# Patient Record
Sex: Female | Born: 1970 | Race: White | Hispanic: No | Marital: Married | Smoking: Current every day smoker
Health system: Southern US, Community
[De-identification: ages and names within clinical notes are randomized; demographics above are authoritative.]

## PROBLEM LIST (undated history)

## (undated) DIAGNOSIS — M797 Fibromyalgia: Secondary | ICD-10-CM

## (undated) DIAGNOSIS — K219 Gastro-esophageal reflux disease without esophagitis: Secondary | ICD-10-CM

## (undated) DIAGNOSIS — Z9889 Other specified postprocedural states: Secondary | ICD-10-CM

## (undated) DIAGNOSIS — R112 Nausea with vomiting, unspecified: Secondary | ICD-10-CM

## (undated) DIAGNOSIS — E119 Type 2 diabetes mellitus without complications: Secondary | ICD-10-CM

## (undated) DIAGNOSIS — N3010 Interstitial cystitis (chronic) without hematuria: Secondary | ICD-10-CM

## (undated) DIAGNOSIS — M1990 Unspecified osteoarthritis, unspecified site: Secondary | ICD-10-CM

## (undated) DIAGNOSIS — G9050 Complex regional pain syndrome I, unspecified: Secondary | ICD-10-CM

## (undated) DIAGNOSIS — N289 Disorder of kidney and ureter, unspecified: Secondary | ICD-10-CM

## (undated) DIAGNOSIS — K828 Other specified diseases of gallbladder: Secondary | ICD-10-CM

## (undated) DIAGNOSIS — Z87442 Personal history of urinary calculi: Secondary | ICD-10-CM

## (undated) HISTORY — PX: CARPAL TUNNEL RELEASE: SHX101

## (undated) HISTORY — PX: ANTERIOR CRUCIATE LIGAMENT REPAIR: SHX115

## (undated) HISTORY — DX: Unspecified osteoarthritis, unspecified site: M19.90

## (undated) HISTORY — PX: TONSILLECTOMY: SUR1361

## (undated) HISTORY — PX: ABDOMINAL HYSTERECTOMY: SHX81

---

## 1997-11-21 ENCOUNTER — Encounter: Admission: RE | Admit: 1997-11-21 | Discharge: 1998-02-19 | Payer: Self-pay | Admitting: Anesthesiology

## 1998-09-22 ENCOUNTER — Ambulatory Visit (HOSPITAL_COMMUNITY): Admission: RE | Admit: 1998-09-22 | Discharge: 1998-09-22 | Payer: Self-pay | Admitting: Orthopedic Surgery

## 1998-09-22 ENCOUNTER — Encounter: Payer: Self-pay | Admitting: Orthopedic Surgery

## 1998-11-01 ENCOUNTER — Ambulatory Visit (HOSPITAL_COMMUNITY): Admission: RE | Admit: 1998-11-01 | Discharge: 1998-11-01 | Payer: Self-pay | Admitting: Orthopedic Surgery

## 1998-11-01 ENCOUNTER — Encounter: Payer: Self-pay | Admitting: Orthopedic Surgery

## 1998-11-15 ENCOUNTER — Ambulatory Visit (HOSPITAL_COMMUNITY): Admission: RE | Admit: 1998-11-15 | Discharge: 1998-11-15 | Payer: Self-pay | Admitting: Orthopedic Surgery

## 1998-11-15 ENCOUNTER — Encounter: Payer: Self-pay | Admitting: Orthopedic Surgery

## 1998-11-29 ENCOUNTER — Ambulatory Visit (HOSPITAL_COMMUNITY): Admission: RE | Admit: 1998-11-29 | Discharge: 1998-11-29 | Payer: Self-pay | Admitting: Orthopedic Surgery

## 1998-11-29 ENCOUNTER — Encounter: Payer: Self-pay | Admitting: Orthopedic Surgery

## 1999-03-20 ENCOUNTER — Ambulatory Visit (HOSPITAL_COMMUNITY): Admission: RE | Admit: 1999-03-20 | Discharge: 1999-03-20 | Payer: Self-pay | Admitting: Orthopedic Surgery

## 1999-03-20 ENCOUNTER — Encounter: Payer: Self-pay | Admitting: Orthopedic Surgery

## 1999-11-20 ENCOUNTER — Encounter: Payer: Self-pay | Admitting: Family Medicine

## 1999-11-20 ENCOUNTER — Encounter: Admission: RE | Admit: 1999-11-20 | Discharge: 1999-11-20 | Payer: Self-pay | Admitting: Family Medicine

## 2001-04-27 ENCOUNTER — Emergency Department (HOSPITAL_COMMUNITY): Admission: EM | Admit: 2001-04-27 | Discharge: 2001-04-27 | Payer: Self-pay | Admitting: Emergency Medicine

## 2002-12-22 ENCOUNTER — Emergency Department (HOSPITAL_COMMUNITY): Admission: EM | Admit: 2002-12-22 | Discharge: 2002-12-22 | Payer: Self-pay

## 2002-12-22 ENCOUNTER — Encounter: Payer: Self-pay | Admitting: Emergency Medicine

## 2004-10-15 ENCOUNTER — Emergency Department (HOSPITAL_COMMUNITY): Admission: EM | Admit: 2004-10-15 | Discharge: 2004-10-15 | Payer: Self-pay | Admitting: Emergency Medicine

## 2006-01-31 ENCOUNTER — Emergency Department (HOSPITAL_COMMUNITY): Admission: EM | Admit: 2006-01-31 | Discharge: 2006-01-31 | Payer: Self-pay | Admitting: Emergency Medicine

## 2006-09-11 ENCOUNTER — Ambulatory Visit (HOSPITAL_COMMUNITY): Admission: RE | Admit: 2006-09-11 | Discharge: 2006-09-11 | Payer: Self-pay | Admitting: Gastroenterology

## 2006-09-17 ENCOUNTER — Encounter (INDEPENDENT_AMBULATORY_CARE_PROVIDER_SITE_OTHER): Payer: Self-pay | Admitting: Specialist

## 2006-09-17 ENCOUNTER — Ambulatory Visit (HOSPITAL_COMMUNITY): Admission: RE | Admit: 2006-09-17 | Discharge: 2006-09-17 | Payer: Self-pay | Admitting: Gastroenterology

## 2006-09-29 ENCOUNTER — Encounter: Admission: RE | Admit: 2006-09-29 | Discharge: 2006-09-29 | Payer: Self-pay | Admitting: Gastroenterology

## 2006-10-24 ENCOUNTER — Encounter: Admission: RE | Admit: 2006-10-24 | Discharge: 2006-10-24 | Payer: Self-pay | Admitting: Gastroenterology

## 2006-12-16 ENCOUNTER — Emergency Department (HOSPITAL_COMMUNITY): Admission: EM | Admit: 2006-12-16 | Discharge: 2006-12-16 | Payer: Self-pay | Admitting: *Deleted

## 2006-12-19 ENCOUNTER — Ambulatory Visit (HOSPITAL_COMMUNITY): Admission: RE | Admit: 2006-12-19 | Discharge: 2006-12-19 | Payer: Self-pay | Admitting: Physical Therapy

## 2007-02-12 ENCOUNTER — Emergency Department (HOSPITAL_COMMUNITY): Admission: EM | Admit: 2007-02-12 | Discharge: 2007-02-12 | Payer: Self-pay | Admitting: Emergency Medicine

## 2007-04-07 ENCOUNTER — Encounter (INDEPENDENT_AMBULATORY_CARE_PROVIDER_SITE_OTHER): Payer: Self-pay | Admitting: Otolaryngology

## 2007-04-07 ENCOUNTER — Ambulatory Visit (HOSPITAL_COMMUNITY): Admission: RE | Admit: 2007-04-07 | Discharge: 2007-04-07 | Payer: Self-pay | Admitting: Otolaryngology

## 2008-02-09 ENCOUNTER — Emergency Department (HOSPITAL_COMMUNITY): Admission: EM | Admit: 2008-02-09 | Discharge: 2008-02-09 | Payer: Self-pay | Admitting: Emergency Medicine

## 2008-05-10 ENCOUNTER — Emergency Department (HOSPITAL_COMMUNITY): Admission: EM | Admit: 2008-05-10 | Discharge: 2008-05-10 | Payer: Self-pay | Admitting: Emergency Medicine

## 2008-06-27 HISTORY — PX: INTERSTIM IMPLANT PLACEMENT: SHX5130

## 2008-09-19 ENCOUNTER — Emergency Department (HOSPITAL_COMMUNITY): Admission: EM | Admit: 2008-09-19 | Discharge: 2008-09-19 | Payer: Self-pay | Admitting: Emergency Medicine

## 2008-09-22 ENCOUNTER — Emergency Department (HOSPITAL_COMMUNITY): Admission: EM | Admit: 2008-09-22 | Discharge: 2008-09-22 | Payer: Self-pay | Admitting: Emergency Medicine

## 2008-09-28 ENCOUNTER — Observation Stay (HOSPITAL_COMMUNITY): Admission: EM | Admit: 2008-09-28 | Discharge: 2008-09-28 | Payer: Self-pay | Admitting: Emergency Medicine

## 2010-09-04 LAB — CBC
HCT: 42.8 % (ref 36.0–46.0)
Hemoglobin: 14.4 g/dL (ref 12.0–15.0)
MCHC: 33.7 g/dL (ref 30.0–36.0)
Platelets: 313 10*3/uL (ref 150–400)
RDW: 13.2 % (ref 11.5–15.5)

## 2010-09-04 LAB — DIFFERENTIAL
Basophils Absolute: 0 10*3/uL (ref 0.0–0.1)
Basophils Relative: 0 % (ref 0–1)
Eosinophils Absolute: 0 10*3/uL (ref 0.0–0.7)
Eosinophils Relative: 0 % (ref 0–5)
Monocytes Absolute: 0.6 10*3/uL (ref 0.1–1.0)

## 2010-09-04 LAB — URINALYSIS, ROUTINE W REFLEX MICROSCOPIC
Glucose, UA: NEGATIVE mg/dL
Protein, ur: 100 mg/dL — AB
Specific Gravity, Urine: 1.025 (ref 1.005–1.030)

## 2010-09-04 LAB — URINE MICROSCOPIC-ADD ON

## 2010-09-04 LAB — URINE CULTURE

## 2010-09-04 LAB — POCT I-STAT, CHEM 8
Calcium, Ion: 1.12 mmol/L (ref 1.12–1.32)
HCT: 46 % (ref 36.0–46.0)
Hemoglobin: 15.6 g/dL — ABNORMAL HIGH (ref 12.0–15.0)
TCO2: 24 mmol/L (ref 0–100)

## 2010-09-05 LAB — COMPREHENSIVE METABOLIC PANEL
ALT: 29 U/L (ref 0–35)
AST: 25 U/L (ref 0–37)
Albumin: 4.7 g/dL (ref 3.5–5.2)
Calcium: 10.1 mg/dL (ref 8.4–10.5)
GFR calc Af Amer: >60 mL/min (ref >60)
Sodium: 139 mEq/L (ref 135–145)
Total Protein: 7.6 g/dL (ref 6.0–8.3)

## 2010-09-05 LAB — URINALYSIS, ROUTINE W REFLEX MICROSCOPIC
Glucose, UA: NEGATIVE mg/dL
Nitrite: NEGATIVE
Nitrite: NEGATIVE
Specific Gravity, Urine: 1.025 (ref 1.005–1.030)
pH: 5.5 (ref 5.0–8.0)
pH: 6 (ref 5.0–8.0)

## 2010-09-05 LAB — URINE CULTURE: Colony Count: 7000

## 2010-09-05 LAB — POCT I-STAT, CHEM 8
Calcium, Ion: 1.08 mmol/L — ABNORMAL LOW (ref 1.12–1.32)
Chloride: 108 mEq/L (ref 96–112)
Glucose, Bld: 150 mg/dL — ABNORMAL HIGH (ref 70–99)
HCT: 43 % (ref 36.0–46.0)
Hemoglobin: 14.6 g/dL (ref 12.0–15.0)

## 2010-09-05 LAB — URINE MICROSCOPIC-ADD ON

## 2010-09-05 LAB — DIFFERENTIAL
Eosinophils Absolute: 0.1 10*3/uL (ref 0.0–0.7)
Eosinophils Relative: 2 % (ref 0–5)
Lymphs Abs: 2.7 10*3/uL (ref 0.7–4.0)
Monocytes Relative: 6 % (ref 3–12)

## 2010-09-05 LAB — CBC
MCHC: 35.1 g/dL (ref 30.0–36.0)
Platelets: 320 10*3/uL (ref 150–400)
RBC: 4.79 MIL/uL (ref 3.87–5.11)
RDW: 12.5 % (ref 11.5–15.5)

## 2010-10-09 NOTE — Op Note (Signed)
NAME:  Bacigalupi, Myrlene                ACCOUNT NO.:  [id number]   MEDICAL RECORD NO.:  [id number]          PATIENT TYPE:  INP   LOCATION:  1517                         FACILITY:  [facility]   PHYSICIAN:  Bertram Millard. Dahlstedt, M.D.DATE OF BIRTH:  10-Apr-1971   DATE OF PROCEDURE:  09/28/2008  DATE OF DISCHARGE:                               OPERATIVE REPORT   PREOPERATIVE DIAGNOSIS:  Right UPJ stone, status post lithotripsy with  intractable pain, nausea and vomiting.   POSTOPERATIVE DIAGNOSIS:  Right UPJ stone, status post lithotripsy with  intractable pain, nausea and vomiting.   PROCEDURE:  Cystoscopy, right double-J stent placement.   SURGEON:  Bertram Millard. Dahlstedt, M.D.   ANESTHESIA:  General endotracheal.   COMPLICATIONS:  None.   BRIEF HISTORY:  A 40 year old female, patient of Dr. Logan Bores at [facility].  She presented to the  emergency room here in [place] about a week ago with a right sided  UPJ stone with pain.  She underwent lithotripsy by Dr. Logan Bores in [facility] approximately 12 hours ago.  Three hours after procedure she began  having significant pain, nausea and vomiting.  This was unrelieved with  Vicodin on several occasions.  She presented the emergency room.  Consultation was requested from me.  The patient has significant  hydronephrosis and renal edema.  This, combined with her clinical  symptomatology, led me to  recommend this urgent stent placement.  The  patient did not want to go to [facility] at this time.  She presents  for system stent placement.   DESCRIPTION OF PROCEDURE:  The patient received preoperative IV  antibiotics.  Surgical side was marked and she was taken to the  operating room where general anesthetic was administered using  endotracheal apparatus.  She was placed in the dorsal lithotomy  position.  Genitalia and perineum were prepped and draped.   Time-out was then called.  Proper procedure, proper  side, patient, drug  allergies, and medications were reviewed.  At this point the procedure  commenced.  A 22-French panendoscope was placed into her bladder.  The  bladder was inspected and found to be normal.  Ureteral orifices were  normal.  A guidewire was placed under fluoroscopic guidance into the  right renal pelvis where good curl was seen.  Over the top of this a 24  cm x 6-French contour stent  was placed.  Good curls were seen proximally and distally using  fluoroscopic and cystoscopic guidance after the guidewire was removed.  The bladder was drained and the procedure terminated.  She was awakened  and taken to PACU in stable condition.      Bertram Millard. Dahlstedt, M.D.  Electronically Signed     SMD/MEDQ  D:  09/28/2008  T:  09/28/2008  Job:  [id number]   cc:   Jamison Neighbor, M.D.  Fax: [phone]

## 2010-10-09 NOTE — Op Note (Signed)
NAME:  Barbara Wu, Barbara Wu                ACCOUNT NO.:  [id number]   MEDICAL RECORD NO.:  [id number]          PATIENT TYPE:  AMB   LOCATION:  SDS                          FACILITY:  [facility]   PHYSICIAN:  Christopher E. Ezzard Standing, M.D.DATE OF BIRTH:  03/04/1971   DATE OF PROCEDURE:  04/07/2007  DATE OF DISCHARGE:  04/07/2007                               OPERATIVE REPORT   PREOPERATIVE DIAGNOSIS:  Recurrent tonsillitis.   POSTOPERATIVE DIAGNOSIS:  Recurrent tonsillitis.   OPERATION PERFORMED:  The tonsillectomy.   SURGEON:  Narda Bonds, M.D.   ANESTHESIA:  General endotracheal.   COMPLICATIONS:  None.   BRIEF CLINICAL NOTE:  Barbara Wu is a 40 year old female who has had  several tonsil infections this past year.  She gets better on  antibiotics but keeps having recurrent tonsil infections, especially  when she gets upper respiratory infection.  On examination she has  cryptic 2+ tonsils with some white debris within the tonsillar crypts,  left side a little bit worse than right.  Because of history of  recurrent tonsil infections, she is taken to operating room at this time  for tonsillectomy.   DESCRIPTION OF PROCEDURE:  After adequate endotracheal anesthesia [name]  received 10 mg of Decadron and 1 gram Ancef IV preoperatively.  A mouth  gag was used to expose the oropharynx.  The left and right tonsils were  then resected from tonsillar fossas with a cautery.  Care was taken to  preserve the anterior present tonsillar pillars as well as the uvula.  Hemostasis was obtained with cautery.  After obtaining adequate  hemostasis, the oropharynx was irrigated with saline.  This completed  the procedure.  [name] was awoke from anesthesia and transferred to the  recovery room postop doing well.   DISPOSITION:  [name] is discharged home later this morning on amoxicillin  suspension 400 mg b.i.d. for one week, Tylenol and either Zamicet or  Lortab elixir p.r.n. pain.  Will have her follow-up in  my office in 10-  14 days for recheck.           ______________________________  Kristine Garbe. Ezzard Standing, M.D.     CEN/MEDQ  D:  04/07/2007  T:  04/08/2007  Job:  [id number]

## 2010-10-12 NOTE — Op Note (Signed)
NAME:  Barbara Wu, Barbara Wu                ACCOUNT NO.:  [id number]   MEDICAL RECORD NO.:  [id number]          PATIENT TYPE:  AMB   LOCATION:  ENDO                         FACILITY:  [facility]   PHYSICIAN:  Anselmo Rod, M.D.  DATE OF BIRTH:  07/18/1970   DATE OF PROCEDURE:  09/17/2006  DATE OF DISCHARGE:  09/17/2006                               OPERATIVE REPORT   PROCEDURE PERFORMED:  Colonoscopy with multiple cold biopsies.   ENDOSCOPIST:  Dr. Anselmo Rod.   INSTRUMENT USED:  Pentax video colonoscope.   INDICATIONS FOR PROCEDURE:  A 40 year old female undergoing a  colonoscopy for change in bowel habits, severe diarrhea and abdominal  pain, rule out IBD.   PREPROCEDURE PREPARATION:  Informed consent was procured from the  patient. The patient fasted for 4 hours prior to the procedure and  prepped with 20 OsmoPrep pills the night of and 12 OsmoPrep pills the  morning of the procedure. The risks and benefits of the procedure  including a 10% miss rate of cancer and polyp were discussed with the  patient as well.   PREPROCEDURE PHYSICAL:  The patient had stable vital signs.  Neck  supple.  Chest clear to auscultation.  Heart was regular.  Abdomen soft  with normal bowel sounds.   DESCRIPTION OF PROCEDURE:  The patient was placed in the left lateral  decubitus position and sedated with 100 mcg of fentanyl and 10 mg of  Versed given intravenously in slow incremental doses.  Once the patient  was adequately sedated and maintained on low-flow oxygen and continuous  cardiac monitoring, the Pentax video colonoscope was advanced from the  rectum to the cecum.  The appendiceal orifice and cecum were clearly  visualized and photographed.  There were ulcerations noted in the  terminal ileum that were biopsied for pathology.  Random colon biopsies  were done to rule out collagenous colitis.  Small internal hemorrhoids  were seen on retroflexion, the patient tolerated the procedure well  without immediate complications.   IMPRESSION:  1. Ulcerations in terminal ileum biopsied to rule out diabetes.  2. Random colon biopsies done to rule out collagenous colitis.  3. Small internal hemorrhoids seen on retroflexion.  4. No masses or polyps seen.   RECOMMENDATIONS:  1. Await pathology results.  2. Avoid all nonsteroidals including aspirin for the next 2 weeks.  3. Repeat colonoscopy depending on pathology results.  4. Outpatient follow-up within the next week for further      recommendations.      Anselmo Rod, M.D.  Electronically Signed     JNM/MEDQ  D:  09/19/2006  T:  09/19/2006  Job:  [id number]   cc:   Ace Gins, MD

## 2011-02-25 LAB — URINALYSIS, ROUTINE W REFLEX MICROSCOPIC
Bilirubin Urine: NEGATIVE
Glucose, UA: NEGATIVE
Ketones, ur: NEGATIVE
Specific Gravity, Urine: 1.01
pH: 7

## 2011-02-25 LAB — HEPATIC FUNCTION PANEL
AST: 20
Albumin: 4.2

## 2011-02-25 LAB — DIFFERENTIAL
Basophils Absolute: 0
Basophils Relative: 1
Neutro Abs: 3.2
Neutrophils Relative %: 54

## 2011-02-25 LAB — CBC
MCHC: 34
RBC: 4.36
RDW: 13.2

## 2011-02-25 LAB — BASIC METABOLIC PANEL
CO2: 23
Calcium: 9.4
Creatinine, Ser: 0.64
GFR calc Af Amer: >60
GFR calc non Af Amer: >60

## 2011-03-01 LAB — DIFFERENTIAL
Basophils Absolute: 0 K/uL (ref 0.0–0.1)
Basophils Relative: 0 % (ref 0–1)
Eosinophils Absolute: 0.2 K/uL (ref 0.0–0.7)
Eosinophils Relative: 3 % (ref 0–5)
Lymphocytes Relative: 44 % (ref 12–46)
Lymphs Abs: 2.4 K/uL (ref 0.7–4.0)
Monocytes Absolute: 0.4 K/uL (ref 0.1–1.0)
Monocytes Relative: 7 % (ref 3–12)
Neutro Abs: 2.4 K/uL (ref 1.7–7.7)
Neutrophils Relative %: 46 % (ref 43–77)

## 2011-03-01 LAB — CBC
HCT: 41.4 % (ref 36.0–46.0)
Hemoglobin: 14 g/dL (ref 12.0–15.0)
Platelets: 261 10*3/uL (ref 150–400)
WBC: 5.4 10*3/uL (ref 4.0–10.5)

## 2011-03-01 LAB — URINALYSIS, ROUTINE W REFLEX MICROSCOPIC
Glucose, UA: NEGATIVE mg/dL
Specific Gravity, Urine: 1.01 (ref 1.005–1.030)
pH: 7 (ref 5.0–8.0)

## 2011-03-01 LAB — POCT I-STAT, CHEM 8
BUN: 11 mg/dL (ref 6–23)
Creatinine, Ser: 0.9 mg/dL (ref 0.4–1.2)
Sodium: 145 mEq/L (ref 135–145)
TCO2: 28 mmol/L (ref 0–100)

## 2011-03-05 LAB — CBC
HCT: 40.4
MCHC: 34
MCV: 93
RBC: 4.35

## 2011-03-05 LAB — PROTIME-INR: Prothrombin Time: 12.1

## 2011-03-07 LAB — URINALYSIS, ROUTINE W REFLEX MICROSCOPIC
Glucose, UA: NEGATIVE
Hgb urine dipstick: NEGATIVE
Protein, ur: NEGATIVE
Specific Gravity, Urine: 1.004 — ABNORMAL LOW
pH: 6.5

## 2011-03-07 LAB — CBC
Hemoglobin: 13.6
MCHC: 34.7
MCV: 91.6
RBC: 4.29
WBC: 5.7

## 2011-03-07 LAB — DIFFERENTIAL
Basophils Absolute: 0.1
Basophils Relative: 2 — ABNORMAL HIGH
Eosinophils Relative: 2
Monocytes Absolute: 0.3

## 2011-03-07 LAB — BASIC METABOLIC PANEL
CO2: 25
Chloride: 110
GFR calc Af Amer: >60
Potassium: 3.7
Sodium: 143

## 2011-03-11 LAB — I-STAT 8, (EC8 V) (CONVERTED LAB)
Acid-base deficit: 1
Chloride: 110
HCT: 41
Operator id: 234501
Potassium: 4
TCO2: 25
pCO2, Ven: 39.3 — ABNORMAL LOW

## 2011-03-11 LAB — POCT I-STAT CREATININE
Creatinine, Ser: 0.7
Operator id: 234501

## 2011-03-11 LAB — URINALYSIS, ROUTINE W REFLEX MICROSCOPIC
Bilirubin Urine: NEGATIVE
Glucose, UA: NEGATIVE
Ketones, ur: NEGATIVE
Protein, ur: NEGATIVE

## 2011-05-05 ENCOUNTER — Emergency Department (INDEPENDENT_AMBULATORY_CARE_PROVIDER_SITE_OTHER): Payer: Medicare Other

## 2011-05-05 ENCOUNTER — Encounter: Payer: Self-pay | Admitting: *Deleted

## 2011-05-05 ENCOUNTER — Emergency Department (HOSPITAL_BASED_OUTPATIENT_CLINIC_OR_DEPARTMENT_OTHER)
Admission: EM | Admit: 2011-05-05 | Discharge: 2011-05-05 | Disposition: A | Discharge status: Home / Self Care | Payer: Medicare Other | Attending: Emergency Medicine | Admitting: Emergency Medicine

## 2011-05-05 DIAGNOSIS — S93401A Sprain of unspecified ligament of right ankle, initial encounter: Secondary | ICD-10-CM

## 2011-05-05 DIAGNOSIS — S8990XA Unspecified injury of unspecified lower leg, initial encounter: Secondary | ICD-10-CM

## 2011-05-05 DIAGNOSIS — M25579 Pain in unspecified ankle and joints of unspecified foot: Secondary | ICD-10-CM

## 2011-05-05 DIAGNOSIS — F17200 Nicotine dependence, unspecified, uncomplicated: Secondary | ICD-10-CM | POA: Insufficient documentation

## 2011-05-05 DIAGNOSIS — X58XXXA Exposure to other specified factors, initial encounter: Secondary | ICD-10-CM | POA: Insufficient documentation

## 2011-05-05 DIAGNOSIS — Y9229 Other specified public building as the place of occurrence of the external cause: Secondary | ICD-10-CM | POA: Insufficient documentation

## 2011-05-05 DIAGNOSIS — S93409A Sprain of unspecified ligament of unspecified ankle, initial encounter: Secondary | ICD-10-CM | POA: Insufficient documentation

## 2011-05-05 HISTORY — DX: Complex regional pain syndrome I, unspecified: G90.50

## 2011-05-05 HISTORY — DX: Interstitial cystitis (chronic) without hematuria: N30.10

## 2011-05-05 MED ORDER — IBUPROFEN 800 MG PO TABS: 800.0000 mg | ORAL_TABLET | Freq: Three times a day (TID) | ORAL | Status: AC | PRN

## 2011-05-05 NOTE — ED Notes (Signed)
Pt states she ?injured her right ankle on Monday.

## 2011-05-05 NOTE — ED Provider Notes (Signed)
History    Scribed for Barbara Bonier, MD, the patient was seen in room [facility]. This chart was scribed by [name].   CSN: [id number] Arrival date & time: 05/05/2011  3:08 PM   First MD Initiated Contact with Patient 05/05/11 1545      Chief Complaint  Patient presents with  . Ankle Pain    (Consider location/radiation/quality/duration/timing/severity/associated sxs/prior treatment) Patient is a 40 y.o. female presenting with ankle pain. The history is provided by the patient. No language interpreter was used.  Ankle Pain  Incident onset: about a week ago  [place]  There was no injury mechanism. The pain is present in the right ankle. Pain severity now: mild to moderate  The pain has been worsening since onset. She reports no foreign bodies present. Exacerbated by: walking  She has tried rest for the symptoms.    Past Medical History  Diagnosis Date  . Interstitial cystitis   . RSD (reflex sympathetic dystrophy)     Past Surgical History  Procedure Date  . Abdominal hysterectomy   . Cesarean section   . Tonsillectomy   . Anterior cruciate ligament repair     History reviewed. No pertinent family history.  History  Substance Use Topics  . Smoking status: Current Everyday Smoker  . Smokeless tobacco: Not on file  . Alcohol Use: Yes    OB History    Grav Para Term Preterm Abortions TAB SAB Ect Mult Living                  Review of Systems  All other systems reviewed and are negative.    Allergies  Demerol; Morphine and related; and Other  Home Medications   Current Outpatient Rx  Name Route Sig Dispense Refill  . DIAZEPAM 5 MG PO TABS Oral Take 5 mg by mouth daily as needed. For anxiety     . DULOXETINE HCL 60 MG PO CPEP Oral Take 60 mg by mouth daily.      Marland Kitchen HYDROXYZINE HCL PO Oral Take 1-2 tablets by mouth at bedtime.      Marland Kitchen PENTOSAN POLYSULFATE SODIUM 100 MG PO CAPS Oral Take 100 mg by mouth 3 (three) times daily  before meals.      . IBUPROFEN 800 MG PO TABS Oral Take 1 tablet (800 mg total) by mouth every 8 (eight) hours as needed for pain. 20 tablet 0    BP 140/83  Pulse 69  Temp(Src) 98.3 F (36.8 C) (Oral)  Resp 18  Ht 5\' 1"  (1.549 m)  Wt 210 lb (95.255 kg)  BMI 39.68 kg/m2  SpO2 100%  Physical Exam  Constitutional: She is oriented to person, place, and time. She appears well-developed and well-nourished. No distress.  HENT:  Head: Normocephalic and atraumatic.  Eyes: Conjunctivae and EOM are normal.  Cardiovascular: Normal rate and regular rhythm.   Pulmonary/Chest: Effort normal. No respiratory distress.  Musculoskeletal: She exhibits tenderness.       Anterior tibiotalar joint tenderness, no deformity to right ankle, mild tenderness to distal, lateral and medial malleolus, intact and FROM of ankle, proximal fibia and knee joint are non tender     Neurological: She is alert and oriented to person, place, and time.  Skin: Skin is warm and dry.  Psychiatric: She has a normal mood and affect. Her behavior is normal.    ED Course  Procedures (including critical care time)   DIAGNOSTIC STUDIES: Oxygen Saturation is 100% on room air,  normal by my interpretation.     COORDINATION OF CARE:    LABS / RADIOLOGY:   Labs Reviewed - No data to display Dg Ankle Complete Right  05/05/2011  *RADIOLOGY REPORT*  Clinical Data: Injury, pain.  RIGHT ANKLE - COMPLETE 3+ VIEW  Comparison: None.  Findings: No acute bony or joint abnormality is identified.  Small plantar calcaneal spur noted.  Soft tissues unremarkable.  IMPRESSION: Negative exam.  Original Report Authenticated By: Bernadene Bell. D'ALESSIO, M.D.         MDM   MDM:  No apparent fracture or dislocation on xray or exam.  Symptoms and findings are compatible with sprain suspecifically  with anterior tibiotalar ligament.          MEDICATIONS GIVEN IN THE E.D. Scheduled Meds:   Continuous Infusions:      IMPRESSION: 1. Right ankle sprain      DISCHARGE MEDICATIONS: New Prescriptions   IBUPROFEN (ADVIL,MOTRIN) 800 MG TABLET    Take 1 tablet (800 mg total) by mouth every 8 (eight) hours as needed for pain.      I personally performed the services described in this documentation, which was scribed in my presence. The recorded information has been reviewed and considered.            Barbara Bonier, MD 05/05/11 [phone]

## 2012-10-14 ENCOUNTER — Encounter (HOSPITAL_COMMUNITY): Payer: Self-pay | Admitting: Pharmacist

## 2012-10-16 ENCOUNTER — Other Ambulatory Visit: Payer: Self-pay | Admitting: Orthopedic Surgery

## 2012-10-22 ENCOUNTER — Encounter (HOSPITAL_COMMUNITY): Payer: Self-pay

## 2012-10-22 ENCOUNTER — Ambulatory Visit (HOSPITAL_COMMUNITY)
Admission: RE | Admit: 2012-10-22 | Discharge: 2012-10-22 | Disposition: A | Discharge status: Home / Self Care | Payer: Medicare Other | Source: Ambulatory Visit | Attending: Surgery | Admitting: Surgery

## 2012-10-22 ENCOUNTER — Encounter (HOSPITAL_COMMUNITY)
Admission: RE | Admit: 2012-10-22 | Discharge: 2012-10-22 | Disposition: A | Discharge status: Home / Self Care | Payer: Medicare Other | Source: Ambulatory Visit | Attending: Orthopedic Surgery | Admitting: Orthopedic Surgery

## 2012-10-22 DIAGNOSIS — Z01810 Encounter for preprocedural cardiovascular examination: Secondary | ICD-10-CM | POA: Insufficient documentation

## 2012-10-22 DIAGNOSIS — Z01812 Encounter for preprocedural laboratory examination: Secondary | ICD-10-CM | POA: Insufficient documentation

## 2012-10-22 DIAGNOSIS — R9431 Abnormal electrocardiogram [ECG] [EKG]: Secondary | ICD-10-CM | POA: Insufficient documentation

## 2012-10-22 DIAGNOSIS — Z01818 Encounter for other preprocedural examination: Secondary | ICD-10-CM | POA: Insufficient documentation

## 2012-10-22 HISTORY — DX: Nausea with vomiting, unspecified: R11.2

## 2012-10-22 HISTORY — DX: Nausea with vomiting, unspecified: Z98.890

## 2012-10-22 HISTORY — DX: Type 2 diabetes mellitus without complications: E11.9

## 2012-10-22 LAB — URINALYSIS, ROUTINE W REFLEX MICROSCOPIC
Bilirubin Urine: NEGATIVE
Glucose, UA: 100 mg/dL — AB
Nitrite: NEGATIVE
Specific Gravity, Urine: 1.024 (ref 1.005–1.030)
pH: 5.5 (ref 5.0–8.0)

## 2012-10-22 LAB — COMPREHENSIVE METABOLIC PANEL
ALT: 37 U/L — ABNORMAL HIGH (ref 0–35)
AST: 26 U/L (ref 0–37)
Albumin: 4.3 g/dL (ref 3.5–5.2)
Alkaline Phosphatase: 47 U/L (ref 39–117)
Chloride: 104 mEq/L (ref 96–112)
Creatinine, Ser: 0.77 mg/dL (ref 0.50–1.10)
Potassium: 3.5 mEq/L (ref 3.5–5.1)
Sodium: 141 mEq/L (ref 135–145)
Total Bilirubin: 0.3 mg/dL (ref 0.3–1.2)

## 2012-10-22 LAB — CBC
Platelets: 254 10*3/uL (ref 150–400)
RBC: 4.74 MIL/uL (ref 3.87–5.11)
RDW: 12.6 % (ref 11.5–15.5)
WBC: 6 10*3/uL (ref 4.0–10.5)

## 2012-10-22 LAB — APTT: aPTT: 26 seconds (ref 24–37)

## 2012-10-22 LAB — PROTIME-INR: INR: 0.9 (ref 0.00–1.49)

## 2012-10-26 NOTE — Progress Notes (Signed)
Anesthesia chart review: Patient is a 42 year old female scheduled for left hip scope with debridement for labral tear by Dr. Eulah Pont on 10/28/2012. History includes smoking, morbid obesity (BMI 40), diabetes mellitus type 2, interstitial cystitis, reflexive sympathetic dystrophy (site/sites not specified), postoperative nausea/vomiting, hysterectomy, InterStim implant placement, tonsillectomy. No PCP is listed.  Preoperative labs noted.  Chest x-ray report on 10/22/2012 showed no worrisome focal or acute cardiopulmonary abnormality identified.  EKG on 10/22/2012 showed normal sinus rhythm, cannot rule out anterior infarct, age undetermined.  Currently, there are no comparison EKGs available. No CV symptoms were documented at her PAT visit.  She will be evaluated by her assigned anesthesiologist on the day of surgery, but if no acute CV symptoms then I would anticipate that she could proceed as planned. The definitive anesthesia plan to be discussed at that time.  Velna Ochs [facility]/Anesthesiology Phone [phone] 10/26/2012 12:25 PM

## 2012-10-27 MED ORDER — CEFAZOLIN SODIUM-DEXTROSE 2-3 GM-% IV SOLR
2.0000 g | INTRAVENOUS | Status: AC
  Administered 2012-10-28: 2 g via INTRAVENOUS
  Filled 2012-10-27: qty 50

## 2012-10-27 NOTE — Progress Notes (Signed)
Pt. Informed of time change,to arrive at 0700. Pt. Voices understanding

## 2012-10-28 ENCOUNTER — Encounter (HOSPITAL_COMMUNITY)
Admission: RE | Disposition: A | Discharge status: Home / Self Care | Payer: Self-pay | Source: Ambulatory Visit | Attending: Orthopedic Surgery

## 2012-10-28 ENCOUNTER — Encounter (HOSPITAL_COMMUNITY): Payer: Self-pay | Admitting: Vascular Surgery

## 2012-10-28 ENCOUNTER — Other Ambulatory Visit: Payer: Self-pay | Admitting: Physician Assistant

## 2012-10-28 ENCOUNTER — Ambulatory Visit (HOSPITAL_COMMUNITY)
Admission: RE | Admit: 2012-10-28 | Discharge: 2012-10-28 | Disposition: A | Discharge status: Home / Self Care | Payer: Medicare Other | Source: Ambulatory Visit | Attending: Orthopedic Surgery | Admitting: Orthopedic Surgery

## 2012-10-28 ENCOUNTER — Encounter: Payer: Self-pay | Admitting: Physician Assistant

## 2012-10-28 ENCOUNTER — Encounter (HOSPITAL_COMMUNITY): Payer: Self-pay | Admitting: Critical Care Medicine

## 2012-10-28 ENCOUNTER — Ambulatory Visit (HOSPITAL_COMMUNITY): Payer: Medicare Other

## 2012-10-28 ENCOUNTER — Ambulatory Visit (HOSPITAL_COMMUNITY): Payer: Medicare Other | Admitting: Critical Care Medicine

## 2012-10-28 DIAGNOSIS — G8929 Other chronic pain: Secondary | ICD-10-CM

## 2012-10-28 DIAGNOSIS — Z885 Allergy status to narcotic agent status: Secondary | ICD-10-CM | POA: Insufficient documentation

## 2012-10-28 DIAGNOSIS — M6580 Other synovitis and tenosynovitis, unspecified site: Secondary | ICD-10-CM | POA: Insufficient documentation

## 2012-10-28 DIAGNOSIS — Z9109 Other allergy status, other than to drugs and biological substances: Secondary | ICD-10-CM | POA: Insufficient documentation

## 2012-10-28 DIAGNOSIS — Z9889 Other specified postprocedural states: Secondary | ICD-10-CM | POA: Insufficient documentation

## 2012-10-28 DIAGNOSIS — F17200 Nicotine dependence, unspecified, uncomplicated: Secondary | ICD-10-CM | POA: Insufficient documentation

## 2012-10-28 DIAGNOSIS — E119 Type 2 diabetes mellitus without complications: Secondary | ICD-10-CM | POA: Insufficient documentation

## 2012-10-28 DIAGNOSIS — R112 Nausea with vomiting, unspecified: Secondary | ICD-10-CM | POA: Insufficient documentation

## 2012-10-28 DIAGNOSIS — G9050 Complex regional pain syndrome I, unspecified: Secondary | ICD-10-CM | POA: Insufficient documentation

## 2012-10-28 DIAGNOSIS — N3010 Interstitial cystitis (chronic) without hematuria: Secondary | ICD-10-CM

## 2012-10-28 DIAGNOSIS — Z91018 Allergy to other foods: Secondary | ICD-10-CM | POA: Insufficient documentation

## 2012-10-28 DIAGNOSIS — M169 Osteoarthritis of hip, unspecified: Secondary | ICD-10-CM | POA: Insufficient documentation

## 2012-10-28 DIAGNOSIS — S73192A Other sprain of left hip, initial encounter: Secondary | ICD-10-CM | POA: Insufficient documentation

## 2012-10-28 DIAGNOSIS — M1610 Unilateral primary osteoarthritis, unspecified hip: Secondary | ICD-10-CM | POA: Insufficient documentation

## 2012-10-28 DIAGNOSIS — M25552 Pain in left hip: Secondary | ICD-10-CM | POA: Insufficient documentation

## 2012-10-28 DIAGNOSIS — Z79899 Other long term (current) drug therapy: Secondary | ICD-10-CM | POA: Insufficient documentation

## 2012-10-28 DIAGNOSIS — S73192D Other sprain of left hip, subsequent encounter: Secondary | ICD-10-CM

## 2012-10-28 HISTORY — PX: HIP ARTHROSCOPY: SHX668

## 2012-10-28 LAB — GLUCOSE, CAPILLARY: Glucose-Capillary: 100 mg/dL — ABNORMAL HIGH (ref 70–99)

## 2012-10-28 SURGERY — ARTHROSCOPY HIP
Anesthesia: General | Site: Hip | Laterality: Left | Wound class: Clean

## 2012-10-28 MED ORDER — PROPOFOL 10 MG/ML IV BOLUS
INTRAVENOUS | Status: DC | PRN
  Administered 2012-10-28: 180 mg via INTRAVENOUS
  Administered 2012-10-28: 20 mg via INTRAVENOUS

## 2012-10-28 MED ORDER — FENTANYL CITRATE 0.05 MG/ML IJ SOLN
INTRAMUSCULAR | Status: AC
  Filled 2012-10-28: qty 2

## 2012-10-28 MED ORDER — NEOSTIGMINE METHYLSULFATE 1 MG/ML IJ SOLN
INTRAMUSCULAR | Status: DC | PRN
  Administered 2012-10-28: 4 mg via INTRAVENOUS

## 2012-10-28 MED ORDER — MIDAZOLAM HCL 5 MG/5ML IJ SOLN
INTRAMUSCULAR | Status: DC | PRN
  Administered 2012-10-28: 2 mg via INTRAVENOUS

## 2012-10-28 MED ORDER — GLYCOPYRROLATE 0.2 MG/ML IJ SOLN
INTRAMUSCULAR | Status: DC | PRN
  Administered 2012-10-28: 0.6 mg via INTRAVENOUS

## 2012-10-28 MED ORDER — ROCURONIUM BROMIDE 100 MG/10ML IV SOLN
INTRAVENOUS | Status: DC | PRN
  Administered 2012-10-28: 40 mg via INTRAVENOUS
  Administered 2012-10-28: 10 mg via INTRAVENOUS

## 2012-10-28 MED ORDER — OXYCODONE-ACETAMINOPHEN 5-325 MG PO TABS
ORAL_TABLET | ORAL | Status: AC
  Filled 2012-10-28: qty 1

## 2012-10-28 MED ORDER — LACTATED RINGERS IV SOLN
INTRAVENOUS | Status: DC
  Administered 2012-10-28: 09:00:00 via INTRAVENOUS

## 2012-10-28 MED ORDER — LIDOCAINE HCL (CARDIAC) 20 MG/ML IV SOLN
INTRAVENOUS | Status: DC | PRN
  Administered 2012-10-28: 70 mg via INTRAVENOUS

## 2012-10-28 MED ORDER — OXYCODONE-ACETAMINOPHEN 5-325 MG PO TABS
1.0000 | ORAL_TABLET | Freq: Once | ORAL | Status: AC
  Administered 2012-10-28: 1 via ORAL

## 2012-10-28 MED ORDER — BUPIVACAINE HCL (PF) 0.25 % IJ SOLN
INTRAMUSCULAR | Status: AC
  Filled 2012-10-28: qty 30

## 2012-10-28 MED ORDER — METHYLPREDNISOLONE ACETATE 80 MG/ML IJ SUSP
INTRAMUSCULAR | Status: AC
  Filled 2012-10-28: qty 1

## 2012-10-28 MED ORDER — ONDANSETRON HCL 4 MG/2ML IJ SOLN
INTRAMUSCULAR | Status: DC | PRN
  Administered 2012-10-28: 4 mg via INTRAVENOUS

## 2012-10-28 MED ORDER — SODIUM CHLORIDE 0.9 % IR SOLN
Status: DC | PRN
  Administered 2012-10-28 (×2): 3000 mL

## 2012-10-28 MED ORDER — METHYLPREDNISOLONE ACETATE 80 MG/ML IJ SUSP
INTRAMUSCULAR | Status: DC | PRN
  Administered 2012-10-28: 80 mg via INTRA_ARTICULAR

## 2012-10-28 MED ORDER — BUPIVACAINE HCL (PF) 0.5 % IJ SOLN
INTRAMUSCULAR | Status: AC
  Filled 2012-10-28: qty 30

## 2012-10-28 MED ORDER — SCOPOLAMINE 1 MG/3DAYS TD PT72
MEDICATED_PATCH | TRANSDERMAL | Status: AC
  Administered 2012-10-28: 1 via TRANSDERMAL
  Filled 2012-10-28: qty 1

## 2012-10-28 MED ORDER — FENTANYL CITRATE 0.05 MG/ML IJ SOLN
25.0000 ug | INTRAMUSCULAR | Status: DC | PRN
  Administered 2012-10-28 (×3): 50 ug via INTRAVENOUS

## 2012-10-28 MED ORDER — OXYCODONE-ACETAMINOPHEN 5-325 MG PO TABS: ORAL_TABLET | ORAL | Status: DC

## 2012-10-28 MED ORDER — BUPIVACAINE HCL 0.5 % IJ SOLN
INTRAMUSCULAR | Status: DC | PRN
  Administered 2012-10-28: 10 mL via INTRA_ARTICULAR

## 2012-10-28 MED ORDER — FENTANYL CITRATE 0.05 MG/ML IJ SOLN
INTRAMUSCULAR | Status: DC | PRN
  Administered 2012-10-28: 100 ug via INTRAVENOUS
  Administered 2012-10-28 (×2): 50 ug via INTRAVENOUS

## 2012-10-28 SURGICAL SUPPLY — 45 items
BLADE DA 4.2 (BLADE) IMPLANT
BLADE GREAT WHITE 4.2 (BLADE) IMPLANT
BLADE SURG 11 STRL SS (BLADE) ×2 IMPLANT
BOOTCOVER CLEANROOM LRG (PROTECTIVE WEAR) ×2 IMPLANT
CLOTH BEACON ORANGE TIMEOUT ST (SAFETY) ×2 IMPLANT
DRAPE STERI IOBAN 125X83 (DRAPES) ×2 IMPLANT
DRSG EMULSION OIL 3X3 NADH (GAUZE/BANDAGES/DRESSINGS) ×2 IMPLANT
DRSG PAD ABDOMINAL 8X10 ST (GAUZE/BANDAGES/DRESSINGS) ×1 IMPLANT
DURAPREP 26ML APPLICATOR (WOUND CARE) ×2 IMPLANT
ELECT MENISCUS 165MM 90D (ELECTRODE) ×2 IMPLANT
GAUZE XEROFORM 1X8 LF (GAUZE/BANDAGES/DRESSINGS) ×1 IMPLANT
GLOVE BIO SURGEON STRL SZ7 (GLOVE) ×1 IMPLANT
GLOVE BIOGEL PI IND STRL 7.5 (GLOVE) IMPLANT
GLOVE BIOGEL PI IND STRL 8 (GLOVE) ×1 IMPLANT
GLOVE BIOGEL PI INDICATOR 7.5 (GLOVE) ×1
GLOVE BIOGEL PI INDICATOR 8 (GLOVE) ×1
GLOVE BIOGEL PI ORTHO PRO 7.5 (GLOVE) ×1
GLOVE ORTHO TXT STRL SZ7.5 (GLOVE) ×4 IMPLANT
GLOVE PI ORTHO PRO STRL 7.5 (GLOVE) IMPLANT
GLOVE SURG SS PI 7.5 STRL IVOR (GLOVE) ×1 IMPLANT
GOWN PREVENTION PLUS XLARGE (GOWN DISPOSABLE) ×1 IMPLANT
GOWN STRL NON-REIN LRG LVL3 (GOWN DISPOSABLE) ×4 IMPLANT
GOWN STRL REIN XL XLG (GOWN DISPOSABLE) ×1 IMPLANT
KIT HIP ARTHROSCOPY (SET/KITS/TRAYS/PACK) ×2 IMPLANT
KIT ROOM TURNOVER OR (KITS) ×2 IMPLANT
MANIFOLD NEPTUNE II (INSTRUMENTS) ×2 IMPLANT
MARKER SKIN DUAL TIP RULER LAB (MISCELLANEOUS) ×2 IMPLANT
NDL HYPO 18GX1.5 BLUNT FILL (NEEDLE) ×1 IMPLANT
NEEDLE HYPO 18GX1.5 BLUNT FILL (NEEDLE) ×2 IMPLANT
PACK SURGICAL SETUP 50X90 (CUSTOM PROCEDURE TRAY) ×2 IMPLANT
PAD ARMBOARD 7.5X6 YLW CONV (MISCELLANEOUS) ×4 IMPLANT
SET ARTHROSCOPY TUBING (MISCELLANEOUS) ×2
SET ARTHROSCOPY TUBING LN (MISCELLANEOUS) ×1 IMPLANT
SHAVER EXTENDED LENGTH 4.2 (BLADE) ×2 IMPLANT
SPONGE GAUZE 4X4 12PLY (GAUZE/BANDAGES/DRESSINGS) ×2 IMPLANT
SPONGE LAP 18X18 X RAY DECT (DISPOSABLE) ×2 IMPLANT
SUT ETHILON 4 0 PS 2 18 (SUTURE) ×2 IMPLANT
TAPE CLOTH 4X10 WHT NS (GAUZE/BANDAGES/DRESSINGS) ×2 IMPLANT
TAPE CLOTH SURG 6X10 WHT LF (GAUZE/BANDAGES/DRESSINGS) ×1 IMPLANT
TOWEL OR 17X24 6PK STRL BLUE (TOWEL DISPOSABLE) ×2 IMPLANT
TOWEL OR 17X26 10 PK STRL BLUE (TOWEL DISPOSABLE) ×2 IMPLANT
TUBE CONNECTING 12X1/4 (SUCTIONS) ×2 IMPLANT
WAND 50 DEG COVAC W/CORD (SURGICAL WAND) IMPLANT
WAND 90 DEG TURBOVAC W/CORD (SURGICAL WAND) IMPLANT
WATER STERILE IRR 1000ML POUR (IV SOLUTION) ×2 IMPLANT

## 2012-10-28 NOTE — H&P (Signed)
Barbara Wu is an 42 y.o. female.   Chief Complaint: left hip chronic pain HPI: 35 yowf with a history of left hip pain for many years.  She received temporary relief from an intra-articular hip injection but symptoms returned.  Plain xrays show no DJD.  She is not a candidate for an MRI due to an implanted bladder device used to treat her interstitial cystitis.  Past Medical History  Diagnosis Date  . Interstitial cystitis   . RSD (reflex sympathetic dystrophy)   . PONV (postoperative nausea and vomiting)   . Diabetes mellitus without complication   . Tear of left acetabular labrum   . Chronic left hip pain   . PONV (postoperative nausea and vomiting)     Past Surgical History  Procedure Laterality Date  . Abdominal hysterectomy    . Cesarean section    . Tonsillectomy    . Anterior cruciate ligament repair    . Carpal tunnel release Bilateral   . Interstim implant placement  Feb 2010    bladder    Family History  Problem Relation Age of Onset  . Seizures    . Stroke     Social History:  reports that she has been smoking.  She does not have any smokeless tobacco history on file. She reports that she does not drink alcohol or use illicit drugs.  Allergies:  Allergies  Allergen Reactions  . Demerol Hives  . Morphine And Related Hives  . Other Hives    Eggplant allergy  . Tape Other (See Comments)    Band-aid (leaves a burn spot on skin)   Current Facility-Administered Medications on File Prior to Visit  Medication Dose Route Frequency Provider Last Rate Last Dose  . fentaNYL (SUBLIMAZE) injection    PRN Elon Alas, CRNA   50 mcg at 10/28/12 1040  . lactated ringers infusion   Intravenous Continuous Judie Petit, MD      . lidocaine (cardiac) 100 mg/26ml (XYLOCAINE) 20 MG/ML injection 2%    PRN Elon Alas, CRNA   70 mg at 10/28/12 1006  . midazolam (VERSED) 5 MG/5ML injection    PRN Elon Alas, CRNA   2 mg at 10/28/12 1001  . propofol  (DIPRIVAN) 10 mg/mL bolus/IV push    PRN Elon Alas, CRNA   180 mg at 10/28/12 1006  . rocuronium (ZEMURON) injection    PRN Elon Alas, CRNA   40 mg at 10/28/12 1006  . sodium chloride irrigation 0.9 %    PRN Loreta Ave, MD   3,000 mL at 10/28/12 1014   Current Outpatient Prescriptions on File Prior to Visit  Medication Sig Dispense Refill  . metFORMIN (GLUCOPHAGE) 500 MG tablet Take 500 mg by mouth 2 (two) times daily with a meal.        (Not in a hospital admission)  Results for orders placed during the hospital encounter of 10/28/12 (from the past 48 hour(s))  GLUCOSE, CAPILLARY     Status: Abnormal   Collection Time    10/28/12  8:10 AM      Result Value Range   Glucose-Capillary 102 (*) 70 - 99 mg/dL   No results found.  Review of Systems  Constitutional: Negative.   HENT: Negative.   Eyes: Negative.   Respiratory: Negative.   Cardiovascular: Negative.   Gastrointestinal: Negative.   Genitourinary: Positive for dysuria, urgency, frequency and hematuria.       Hx of interstitial cystitis  Musculoskeletal:  Positive for joint pain.       Left hip  Skin: Negative.   Neurological: Negative.   Endo/Heme/Allergies: Negative.   Psychiatric/Behavioral: Negative.      Ht 5'1"   Wt 217   BP  130/88  Pulse 76   Temp 98.2 Physical Exam  Constitutional: She is oriented to person, place, and time. She appears well-developed and well-nourished.  HENT:  Head: Normocephalic and atraumatic.  Mouth/Throat: Oropharynx is clear and moist.  Eyes: Conjunctivae and EOM are normal. Pupils are equal, round, and reactive to light.  Neck: Neck supple.  Cardiovascular: Normal rate, regular rhythm and normal heart sounds.  Exam reveals no gallop and no friction rub.   No murmur heard. Respiratory: Effort normal and breath sounds normal. No respiratory distress. She has no wheezes. She has no rales.  GI: Soft. Bowel sounds are normal. She exhibits no distension.   Genitourinary:  Not pertinent to current symptomatology therefore not examined.  Musculoskeletal: She exhibits tenderness.  The patient is independently ambulatory with a moderately antalgic gait and no assistive devices.  Left hip flexion to 110 degrees pain with internal and external rotation to 15 degrees...   Right hip has full range of motion without pain, stiffness, or mechanical symptoms.  2+DP pulses.  Bilaterally equally motor and sensory exam.  Neurological: She is alert and oriented to person, place, and time.  Skin: Skin is warm and dry.  Psychiatric: She has a normal mood and affect. Her behavior is normal.     Assessment Left Hip Pain Patient Active Problem List   Diagnosis Date Noted  . Interstitial cystitis   . RSD (reflex sympathetic dystrophy)   . PONV (postoperative nausea and vomiting)   . Diabetes mellitus without complication   . Tear of left acetabular labrum   . Chronic left hip pain      Plan Risks benefits and possible complications of a left hip arthroscopy were discussed in detail with the patient.  She is without question.  Consent was signed.  We will procede when scheduled.  Padme Arriaga J 10/28/2012, 10:38 AM

## 2012-10-28 NOTE — Anesthesia Procedure Notes (Signed)
Procedure Name: Intubation Date/Time: 10/28/2012 10:07 AM Performed by: Elon Alas Pre-anesthesia Checklist: Patient identified, Timeout performed and Emergency Drugs available Patient Re-evaluated:Patient Re-evaluated prior to inductionOxygen Delivery Method: Circle system utilized Preoxygenation: Pre-oxygenation with 100% oxygen Intubation Type: IV induction Ventilation: Mask ventilation without difficulty Laryngoscope Size: Mac and 3 Grade View: Grade I Tube type: Oral Tube size: 7.0 mm Number of attempts: 1 Airway Equipment and Method: Stylet Placement Confirmation: positive ETCO2,  ETT inserted through vocal cords under direct vision and breath sounds checked- equal and bilateral Secured at: 21 cm Tube secured with: Tape Dental Injury: Teeth and Oropharynx as per pre-operative assessment

## 2012-10-28 NOTE — Transfer of Care (Signed)
Immediate Anesthesia Transfer of Care Note  Patient: Barbara Wu  Procedure(s) Performed: Procedure(s): ARTHROSCOPY HIP- left (Left)  Patient Location: PACU  Anesthesia Type:general  Level of Consciousness: awake and alert and oriented   Airway & Oxygen Therapy: Patient Spontanous Breathing and Patient connected to nasal cannula oxygen  Post-op Assessment: Report given to PACU RN and Post -op Vital signs reviewed and stable  Post vital signs: Reviewed and stable  Complications: No apparent anesthesia complications

## 2012-10-28 NOTE — H&P (Signed)
  Synovitis/early DJD left hip. Plan arthroscopic debridement. Procedure, risks, benefits, complications reviewed. All questioned answered.

## 2012-10-28 NOTE — Anesthesia Postprocedure Evaluation (Signed)
  Anesthesia Post-op Note  Patient: Barbara Wu  Procedure(s) Performed: Procedure(s): ARTHROSCOPY HIP- left (Left)  Patient Location: PACU  Anesthesia Type:General  Level of Consciousness: awake  Airway and Oxygen Therapy: Patient Spontanous Breathing  Post-op Pain: mild  Post-op Assessment: Post-op Vital signs reviewed  Post-op Vital Signs: Reviewed  Complications: No apparent anesthesia complications

## 2012-10-28 NOTE — Anesthesia Preprocedure Evaluation (Addendum)
Anesthesia Evaluation  Patient identified by MRN, date of birth, ID band Patient awake    Reviewed: Allergy & Precautions, H&P , NPO status , Patient's Chart, lab work & pertinent test results  History of Anesthesia Complications (+) PONV  Airway Mallampati: II TM Distance: >3 FB Neck ROM: Full    Dental  (+) Dental Advisory Given and Teeth Intact   Pulmonary Current Smoker,  breath sounds clear to auscultation        Cardiovascular negative cardio ROS  Rhythm:Regular Rate:Normal     Neuro/Psych    GI/Hepatic negative GI ROS, Neg liver ROS,   Endo/Other  diabetes, Oral Hypoglycemic AgentsMorbid obesity  Renal/GU      Musculoskeletal   Abdominal   Peds  Hematology   Anesthesia Other Findings   Reproductive/Obstetrics                         Anesthesia Physical Anesthesia Plan  ASA: III  Anesthesia Plan: General   Post-op Pain Management:    Induction: Intravenous  Airway Management Planned: Oral ETT  Additional Equipment:   Intra-op Plan:   Post-operative Plan: Extubation in OR  Informed Consent: I have reviewed the patients History and Physical, chart, labs and discussed the procedure including the risks, benefits and alternatives for the proposed anesthesia with the patient or authorized representative who has indicated his/her understanding and acceptance.   Dental advisory given  Plan Discussed with: Anesthesiologist, Surgeon and CRNA  Anesthesia Plan Comments:        Anesthesia Quick Evaluation

## 2012-10-28 NOTE — Preoperative (Signed)
Beta Blockers   Reason not to administer Beta Blockers:Not Applicable 

## 2012-10-28 NOTE — Progress Notes (Signed)
Pt had infiltration on left hand dressing on hand elevated and 2+ radial left hand pulse present fingers warm and good capillary refill

## 2012-10-29 NOTE — Op Note (Signed)
NAME:  Barbara Wu, Barbara Wu                ACCOUNT NO.:  [id number]  MEDICAL RECORD NO.:  [id number]  LOCATION:  [facility]                         FACILITY:  [facility]  PHYSICIAN:  Loreta Ave, M.D. DATE OF BIRTH:  Aug 14, 1970  DATE OF PROCEDURE:  10/28/2012 DATE OF DISCHARGE:  10/28/2012                              OPERATIVE REPORT   PREOPERATIVE DIAGNOSIS:  Synovitis, question labrum tear, left hip.  POSTOPERATIVE DIAGNOSIS:  Synovitis, question labrum tear, left hip with very minimal synovitis and very mild fraying posterior superior labrum. No other degenerative changes.  PROCEDURE:  Left hip exam arthroscopy under anesthesia.  Debridement of labrum, partial synovectomy.  SURGEON:  Loreta Ave, M.D.  ASSISTANT:  Julien Girt, PA-C.  ANESTHESIA:  General.  ESTIMATED BLOOD LOSS:  Minimal.  SPECIMENS:  None.  CULTURES:  None.  COMPLICATION:  None.  DRESSING:  Soft compressive.  PROCEDURE IN DETAIL:  The patient was brought to operating room and after adequate anesthesia had been obtained, she was placed on the fracture table, lateral position.  Appropriate padding and support. Longitudinal traction.  I confirmed good positioning and the ability to distract the hip.  Traction removed.  Prepped and draped in the usual sterile fashion.  Traction reapplied.  Fluoroscopic guidance used throughout.  Two portals, anterolateral and posterolateral, utilizing a sequence of long spinal needle guidewire and then obturator.  The hip was approached with 2 cannulas.  The arthroscope was introduced.  The hip was distended and inspected.  Absolutely no degenerative changes. Articular cartilage in the acetabulum and femur looked great.  The labrum was intact with the exception of a little bit of fraying posterosuperior debrided.  The localized synovitis there also debrided. No loose bodies.  No evidence of acetabulofemoral impingement. Excellent viewing throughout.  Instruments were  removed.  Hip was injected with Depo-Medrol and Marcaine.  Fluoroscopy was used to confirm good reduction after completion.  Wound was injected with Marcaine, closed with nylon. Sterile compressive dressing was applied.  Returned to supine position. Anesthesia reversed.  Brought to the recovery room.  Tolerated surgery well.  No complications.     Loreta Ave, M.D.     DFM/MEDQ  D:  10/28/2012  T:  10/29/2012  Job:  [id number]

## 2012-10-30 ENCOUNTER — Encounter (HOSPITAL_COMMUNITY): Payer: Self-pay | Admitting: Orthopedic Surgery

## 2013-02-24 ENCOUNTER — Other Ambulatory Visit: Payer: Self-pay | Admitting: Physician Assistant

## 2013-03-05 ENCOUNTER — Encounter (HOSPITAL_BASED_OUTPATIENT_CLINIC_OR_DEPARTMENT_OTHER): Payer: Self-pay | Admitting: Emergency Medicine

## 2013-03-05 ENCOUNTER — Emergency Department (HOSPITAL_BASED_OUTPATIENT_CLINIC_OR_DEPARTMENT_OTHER): Payer: Medicare Other

## 2013-03-05 ENCOUNTER — Emergency Department (HOSPITAL_BASED_OUTPATIENT_CLINIC_OR_DEPARTMENT_OTHER)
Admission: EM | Admit: 2013-03-05 | Discharge: 2013-03-05 | Disposition: A | Discharge status: Home / Self Care | Payer: Medicare Other | Attending: Emergency Medicine | Admitting: Emergency Medicine

## 2013-03-05 DIAGNOSIS — R1011 Right upper quadrant pain: Secondary | ICD-10-CM | POA: Insufficient documentation

## 2013-03-05 DIAGNOSIS — E119 Type 2 diabetes mellitus without complications: Secondary | ICD-10-CM | POA: Insufficient documentation

## 2013-03-05 DIAGNOSIS — R1013 Epigastric pain: Secondary | ICD-10-CM

## 2013-03-05 DIAGNOSIS — Z8669 Personal history of other diseases of the nervous system and sense organs: Secondary | ICD-10-CM | POA: Insufficient documentation

## 2013-03-05 DIAGNOSIS — Z79899 Other long term (current) drug therapy: Secondary | ICD-10-CM | POA: Insufficient documentation

## 2013-03-05 DIAGNOSIS — G8929 Other chronic pain: Secondary | ICD-10-CM | POA: Insufficient documentation

## 2013-03-05 DIAGNOSIS — Z8742 Personal history of other diseases of the female genital tract: Secondary | ICD-10-CM | POA: Insufficient documentation

## 2013-03-05 DIAGNOSIS — F17200 Nicotine dependence, unspecified, uncomplicated: Secondary | ICD-10-CM | POA: Insufficient documentation

## 2013-03-05 LAB — CBC WITH DIFFERENTIAL/PLATELET
Basophils Relative: 0 % (ref 0–1)
Eosinophils Absolute: 0.1 10*3/uL (ref 0.0–0.7)
Eosinophils Relative: 1 % (ref 0–5)
HCT: 39.9 % (ref 36.0–46.0)
Hemoglobin: 13.8 g/dL (ref 12.0–15.0)
MCH: 31.4 pg (ref 26.0–34.0)
MCHC: 34.6 g/dL (ref 30.0–36.0)
MCV: 90.7 fL (ref 78.0–100.0)
Monocytes Absolute: 0.7 10*3/uL (ref 0.1–1.0)
Monocytes Relative: 8 % (ref 3–12)

## 2013-03-05 LAB — COMPREHENSIVE METABOLIC PANEL
Albumin: 4.1 g/dL (ref 3.5–5.2)
BUN: 18 mg/dL (ref 6–23)
Creatinine, Ser: 0.8 mg/dL (ref 0.50–1.10)
GFR calc Af Amer: >90 mL/min (ref >90)
Total Protein: 7.4 g/dL (ref 6.0–8.3)

## 2013-03-05 LAB — URINALYSIS, ROUTINE W REFLEX MICROSCOPIC
Bilirubin Urine: NEGATIVE
Glucose, UA: NEGATIVE mg/dL
Ketones, ur: NEGATIVE mg/dL
Nitrite: NEGATIVE
pH: 6.5 (ref 5.0–8.0)

## 2013-03-05 LAB — LIPASE, BLOOD: Lipase: 26 U/L (ref 11–59)

## 2013-03-05 MED ORDER — GI COCKTAIL ~~LOC~~
30.0000 mL | Freq: Once | ORAL | Status: AC
  Administered 2013-03-05: 30 mL via ORAL
  Filled 2013-03-05: qty 30

## 2013-03-05 MED ORDER — HYDROCODONE-ACETAMINOPHEN 5-325 MG PO TABS: 2.0000 | ORAL_TABLET | ORAL | Status: DC | PRN

## 2013-03-05 MED ORDER — GI COCKTAIL ~~LOC~~
ORAL | Status: AC
  Filled 2013-03-05: qty 30

## 2013-03-05 NOTE — ED Notes (Signed)
Patient with abdominal pain, bloating feeling for about a week.  Patient also has been experiencing nausea with vomiting all week intermittently.  Abdominal pain is constant and gets worse when she eats or bends.

## 2013-03-05 NOTE — ED Provider Notes (Signed)
CSN: [id number]     Arrival date & time 03/05/13  1928 History   None   Scribed for Geoffery Lyons, MD, the patient was seen in room MH09/MH09. This chart was scribed by Lewanda Rife, ED scribe. Patient's care was started at 7:43 PM  Chief Complaint  Patient presents with  . Abdominal Pain   (Consider location/radiation/quality/duration/timing/severity/associated sxs/prior Treatment) The history is provided by the patient. No language interpreter was used.   HPI Comments: Barbara Wu is a 42 y.o. female who presents to the Emergency Department complaining of constant worsening abdominal pain onset 7 days. Describes pain as moderate in severity, aching, and burning. Reports associated intermittent nausea, and emesis. Reports pain is exacerbated by bending, laying flat, and eating. Denies any alleviating factors. Denies associated fever, constipation, and dysuria. Reports PMHx of abdominal hysterectomy and oophorectomy.   Past Medical History  Diagnosis Date  . Interstitial cystitis   . RSD (reflex sympathetic dystrophy)   . PONV (postoperative nausea and vomiting)   . Diabetes mellitus without complication   . Tear of left acetabular labrum   . Chronic left hip pain   . PONV (postoperative nausea and vomiting)    Past Surgical History  Procedure Laterality Date  . Abdominal hysterectomy    . Cesarean section    . Tonsillectomy    . Anterior cruciate ligament repair    . Carpal tunnel release Bilateral   . Interstim implant placement  Feb 2010    bladder  . Hip arthroscopy Left 10/28/2012    Procedure: ARTHROSCOPY HIP- left;  Surgeon: Loreta Ave, MD;  Location: [facility] OR;  Service: Orthopedics;  Laterality: Left;   Family History  Problem Relation Age of Onset  . Seizures    . Stroke     History  Substance Use Topics  . Smoking status: Current Every Day Smoker -- 0.50 packs/day for 27 years  . Smokeless tobacco: Not on file  . Alcohol Use: No   OB History   Grav  Para Term Preterm Abortions TAB SAB Ect Mult Living                 Review of Systems  Constitutional: Negative for fever.  Gastrointestinal: Positive for abdominal pain.  All other systems reviewed and are negative.   A complete 10 system review of systems was obtained and all systems are negative except as noted in the HPI and PMHx.    Allergies  Demerol; Morphine and related; Other; and Tape  Home Medications   Current Outpatient Rx  Name  Route  Sig  Dispense  Refill  . metFORMIN (GLUCOPHAGE) 500 MG tablet   Oral   Take 500 mg by mouth 2 (two) times daily with a meal.         . oxyCODONE-acetaminophen (ROXICET) 5-325 MG per tablet      1-2 tablets every 4-6 hrs as needed for pain   40 tablet   0    BP 162/74  Pulse 77  Temp(Src) 98.5 F (36.9 C) (Oral)  Resp 18  Ht 5\' 1"  (1.549 m)  Wt 198 lb (89.812 kg)  BMI 37.43 kg/m2  SpO2 100% Physical Exam  Nursing note and vitals reviewed. Constitutional: She is oriented to person, place, and time. She appears well-developed and well-nourished. No distress.  HENT:  Head: Normocephalic and atraumatic.  Eyes: EOM are normal.  Neck: Neck supple. No tracheal deviation present.  Cardiovascular: Normal rate and regular rhythm.   No murmur heard.  Pulmonary/Chest: Effort normal and breath sounds normal. No respiratory distress.  Abdominal: Soft. Bowel sounds are normal. There is tenderness. There is no rebound and no guarding.  TTP in the epigastrium and RUQ without rebound or guarding. Murphy's sign is absent.   Musculoskeletal: Normal range of motion.  Neurological: She is alert and oriented to person, place, and time.  Skin: Skin is warm and dry. No rash noted.  Psychiatric: She has a normal mood and affect. Her behavior is normal.    ED Course  Procedures (including critical care time) DIAGNOSTIC STUDIES: Oxygen Saturation is 100% on room air, normal by my interpretation.    COORDINATION OF CARE:  Nursing notes  reviewed. Vital signs reviewed. Initial pt interview and examination performed.   7:50 PM-Discussed work up plan with pt at bedside, which includes [place] of abdomen, Lipase, CBC with diff panel, CMP, and UA . Pt agrees with plan.   Treatment plan initiated:Medications - No data to display   Initial diagnostic testing ordered.    Labs Review Labs Reviewed  URINALYSIS, ROUTINE W REFLEX MICROSCOPIC  CBC WITH DIFFERENTIAL  COMPREHENSIVE METABOLIC PANEL  LIPASE, BLOOD   Imaging Review No results found.  EKG Interpretation   None       MDM  No diagnosis found. Patient is a 42 year old female with who presents with complaints of epigastric discomfort. She states this has been occurring all week off and on. There no fevers or chills. She states it is worse when she eats and tries to bend forward. Physical examination reveals tenderness in the epigastric region with no rebound and no guarding. Workup was initiated including CBC and comprehensive metabolic panel. These were unremarkable with the exception of an elevated glucose of 142. There was no white count and no elevation of liver functions. Her lipase was 26 and urinalysis was unremarkable. Patient is status post hysterectomy so no pregnancy test was obtained.  She was given a GI cocktail and is feeling somewhat better. Her abdomen was reexamined after a negative ultrasound and was benign. She has no right lower quadrant tenderness but continues with some degree of tenderness in the epigastrium without rebound or guarding. There is no fever, no white count, and the patient appears nontoxic I doubt there is an emergently surgical issue. I suspect this is likely gastritis and we'll treat with an antacid.  I personally performed the services described in this documentation, which was scribed in my presence. The recorded information has been reviewed and is accurate.      Geoffery Lyons, MD 03/05/13 2132

## 2013-06-09 ENCOUNTER — Encounter: Payer: Self-pay | Admitting: Orthopedic Surgery

## 2013-06-09 NOTE — Progress Notes (Unsigned)
Patient ID: Barbara Wu, female   DOB: 07/24/1970, 43 y.o.   MRN: [id number] [facility]28[facility]. Agree and approve

## 2013-07-18 ENCOUNTER — Emergency Department (HOSPITAL_BASED_OUTPATIENT_CLINIC_OR_DEPARTMENT_OTHER): Payer: Medicare Other

## 2013-07-18 ENCOUNTER — Encounter (HOSPITAL_BASED_OUTPATIENT_CLINIC_OR_DEPARTMENT_OTHER): Payer: Self-pay | Admitting: Emergency Medicine

## 2013-07-18 ENCOUNTER — Emergency Department (HOSPITAL_BASED_OUTPATIENT_CLINIC_OR_DEPARTMENT_OTHER)
Admission: EM | Admit: 2013-07-18 | Discharge: 2013-07-18 | Disposition: A | Discharge status: Home / Self Care | Payer: Medicare Other | Attending: Emergency Medicine | Admitting: Emergency Medicine

## 2013-07-18 DIAGNOSIS — E119 Type 2 diabetes mellitus without complications: Secondary | ICD-10-CM | POA: Insufficient documentation

## 2013-07-18 DIAGNOSIS — Z87828 Personal history of other (healed) physical injury and trauma: Secondary | ICD-10-CM | POA: Insufficient documentation

## 2013-07-18 DIAGNOSIS — K529 Noninfective gastroenteritis and colitis, unspecified: Secondary | ICD-10-CM

## 2013-07-18 DIAGNOSIS — F17200 Nicotine dependence, unspecified, uncomplicated: Secondary | ICD-10-CM | POA: Insufficient documentation

## 2013-07-18 DIAGNOSIS — Z79899 Other long term (current) drug therapy: Secondary | ICD-10-CM | POA: Insufficient documentation

## 2013-07-18 DIAGNOSIS — K5289 Other specified noninfective gastroenteritis and colitis: Secondary | ICD-10-CM | POA: Insufficient documentation

## 2013-07-18 DIAGNOSIS — G8929 Other chronic pain: Secondary | ICD-10-CM | POA: Insufficient documentation

## 2013-07-18 DIAGNOSIS — Z87448 Personal history of other diseases of urinary system: Secondary | ICD-10-CM | POA: Insufficient documentation

## 2013-07-18 LAB — CBC WITH DIFFERENTIAL/PLATELET
BASOS ABS: 0 10*3/uL (ref 0.0–0.1)
Basophils Relative: 0 % (ref 0–1)
EOS PCT: 2 % (ref 0–5)
Eosinophils Absolute: 0.1 10*3/uL (ref 0.0–0.7)
HEMATOCRIT: 41.4 % (ref 36.0–46.0)
Hemoglobin: 14.2 g/dL (ref 12.0–15.0)
LYMPHS ABS: 3.7 10*3/uL (ref 0.7–4.0)
LYMPHS PCT: 48 % — AB (ref 12–46)
MCH: 31.5 pg (ref 26.0–34.0)
MCHC: 34.3 g/dL (ref 30.0–36.0)
MCV: 91.8 fL (ref 78.0–100.0)
MONO ABS: 0.4 10*3/uL (ref 0.1–1.0)
Monocytes Relative: 5 % (ref 3–12)
Neutro Abs: 3.5 10*3/uL (ref 1.7–7.7)
Neutrophils Relative %: 46 % (ref 43–77)
Platelets: 231 10*3/uL (ref 150–400)
RBC: 4.51 MIL/uL (ref 3.87–5.11)
RDW: 12.8 % (ref 11.5–15.5)
WBC: 7.7 10*3/uL (ref 4.0–10.5)

## 2013-07-18 LAB — COMPREHENSIVE METABOLIC PANEL
ALT: 15 U/L (ref 0–35)
AST: 15 U/L (ref 0–37)
Albumin: 4.1 g/dL (ref 3.5–5.2)
Alkaline Phosphatase: 48 U/L (ref 39–117)
BUN: 22 mg/dL (ref 6–23)
CO2: 25 meq/L (ref 19–32)
Calcium: 9.4 mg/dL (ref 8.4–10.5)
Chloride: 103 mEq/L (ref 96–112)
Creatinine, Ser: 0.7 mg/dL (ref 0.50–1.10)
GFR calc Af Amer: >90 mL/min (ref >90)
Glucose, Bld: 162 mg/dL — ABNORMAL HIGH (ref 70–99)
Potassium: 3.7 mEq/L (ref 3.7–5.3)
SODIUM: 141 meq/L (ref 137–147)
TOTAL PROTEIN: 7.3 g/dL (ref 6.0–8.3)
Total Bilirubin: 0.4 mg/dL (ref 0.3–1.2)

## 2013-07-18 LAB — URINALYSIS, ROUTINE W REFLEX MICROSCOPIC
GLUCOSE, UA: NEGATIVE mg/dL
HGB URINE DIPSTICK: NEGATIVE
Ketones, ur: 15 mg/dL — AB
Leukocytes, UA: NEGATIVE
Nitrite: NEGATIVE
PROTEIN: NEGATIVE mg/dL
Specific Gravity, Urine: 1.033 — ABNORMAL HIGH (ref 1.005–1.030)
Urobilinogen, UA: 1 mg/dL (ref 0.0–1.0)
pH: 5.5 (ref 5.0–8.0)

## 2013-07-18 LAB — LIPASE, BLOOD: Lipase: 23 U/L (ref 11–59)

## 2013-07-18 MED ORDER — SODIUM CHLORIDE 0.9 % IV BOLUS (SEPSIS)
1000.0000 mL | Freq: Once | INTRAVENOUS | Status: AC
  Administered 2013-07-18: 1000 mL via INTRAVENOUS

## 2013-07-18 MED ORDER — DIPHENHYDRAMINE HCL 50 MG/ML IJ SOLN
25.0000 mg | Freq: Once | INTRAMUSCULAR | Status: AC
  Administered 2013-07-18: 25 mg via INTRAVENOUS

## 2013-07-18 MED ORDER — HYDROMORPHONE HCL PF 1 MG/ML IJ SOLN
1.0000 mg | Freq: Once | INTRAMUSCULAR | Status: AC
  Administered 2013-07-18: 1 mg via INTRAVENOUS
  Filled 2013-07-18: qty 1

## 2013-07-18 MED ORDER — IOHEXOL 300 MG/ML  SOLN
100.0000 mL | Freq: Once | INTRAMUSCULAR | Status: AC | PRN
  Administered 2013-07-18: 100 mL via INTRAVENOUS

## 2013-07-18 MED ORDER — DIPHENHYDRAMINE HCL 50 MG/ML IJ SOLN
INTRAMUSCULAR | Status: AC
  Filled 2013-07-18: qty 1

## 2013-07-18 MED ORDER — PROMETHAZINE HCL 25 MG PO TABS: 25.0000 mg | ORAL_TABLET | Freq: Four times a day (QID) | ORAL | Status: DC | PRN

## 2013-07-18 MED ORDER — ONDANSETRON HCL 4 MG/2ML IJ SOLN
4.0000 mg | Freq: Once | INTRAMUSCULAR | Status: AC
  Administered 2013-07-18: 4 mg via INTRAVENOUS
  Filled 2013-07-18: qty 2

## 2013-07-18 MED ORDER — IOHEXOL 300 MG/ML  SOLN
50.0000 mL | Freq: Once | INTRAMUSCULAR | Status: AC | PRN
  Administered 2013-07-18: 50 mL via ORAL

## 2013-07-18 NOTE — ED Provider Notes (Signed)
CSN: [id number]     Arrival date & time 07/18/13  1513 History  This chart was scribed for Hilario Quarryanielle S Abir Eroh, MD by Nicholos Johnsenise Iheanachor, ED scribe. This patient was seen in room MH12/MH12 and the patient's care was started at 3:48 PM.  Chief Complaint  Patient presents with  . Abdominal Pain   Patient is a 43 y.o. female presenting with abdominal pain. The history is provided by the patient. No language interpreter was used.  Abdominal Pain Pain location:  Epigastric, RUQ and RLQ Pain quality: aching   Pain radiates to:  Epigastric region Pain severity:  Moderate Onset quality:  Gradual Duration:  4 days Timing:  Constant Progression:  Unchanged Relieved by:  Nothing Worsened by:  Nothing tried Ineffective treatments:  None tried Associated symptoms: no fever    HPI Comments: Barbara Wu is a 43 y.o. female who presents to the Emergency Department complaining of constant, aching, right sided abdominal pain, onset 4 days ago. Pain worse after eating. States she started feeling nauseous and vomited 4 days ago; have since resolved. Had diarrhea 2 days ago; has not had BM since. States after she eats she feels nauseous so has not eaten today. Only drinking water and diet soda. Pt is a diabetic that is controlled through diet. Also has interstitial cystitis which results in constant frequency. Has had a C-Section and hysterectomy. Denies fever.  Past Medical History  Diagnosis Date  . Interstitial cystitis   . RSD (reflex sympathetic dystrophy)   . PONV (postoperative nausea and vomiting)   . Diabetes mellitus without complication   . Tear of left acetabular labrum   . Chronic left hip pain   . PONV (postoperative nausea and vomiting)    Past Surgical History  Procedure Laterality Date  . Abdominal hysterectomy    . Cesarean section    . Tonsillectomy    . Anterior cruciate ligament repair    . Carpal tunnel release Bilateral   . Interstim implant placement  Feb 2010    bladder  .  Hip arthroscopy Left 10/28/2012    Procedure: ARTHROSCOPY HIP- left;  Surgeon: Loreta Aveaniel F Murphy, MD;  [facility];  Service: Orthopedics;  Laterality: Left;   Family History  Problem Relation Age of Onset  . Seizures    . Stroke     History  Substance Use Topics  . Smoking status: Current Every Day Smoker -- 0.50 packs/day for 27 years  . Smokeless tobacco: Not on file  . Alcohol Use: No   OB History   Grav Para Term Preterm Abortions TAB SAB Ect Mult Living                 Review of Systems  Constitutional: Negative for fever.  Gastrointestinal: Positive for abdominal pain.  Genitourinary: Negative for frequency.   Allergies  Demerol; Morphine and related; Other; and Tape  Home Medications   Current Outpatient Rx  Name  Route  Sig  Dispense  Refill  . HYDROcodone-acetaminophen (NORCO) 5-325 MG per tablet   Oral   Take 2 tablets by mouth every 4 (four) hours as needed for pain.   10 tablet   0   . metFORMIN (GLUCOPHAGE) 500 MG tablet   Oral   Take 500 mg by mouth 2 (two) times daily with a meal.         . oxyCODONE-acetaminophen (ROXICET) 5-325 MG per tablet      1-2 tablets every 4-6 hrs as needed for pain  40 tablet   0    Triage Vitals: BP 135/81  Pulse 66  Temp(Src) 98.3 F (36.8 C) (Oral)  Resp 19  Ht 5\' 1"  (1.549 m)  Wt 197 lb (89.359 kg)  BMI 37.24 kg/m2  SpO2 100%  Physical Exam  Nursing note and vitals reviewed. Constitutional: She is oriented to person, place, and time. She appears well-developed and well-nourished.  HENT:  Head: Normocephalic and atraumatic.  Eyes: Conjunctivae and EOM are normal.  Neck: Normal range of motion.  Cardiovascular: Normal rate and normal heart sounds.   Pulmonary/Chest: Effort normal. No respiratory distress.  Abdominal: Soft. There is tenderness.  Diffuse epigastric tenderness in RLQ and RUQ, pain worse in RLQ  Musculoskeletal: Normal range of motion. She exhibits no tenderness.  Lymphadenopathy:     She has no cervical adenopathy.  Neurological: She is oriented to person, place, and time. She has normal reflexes.  Skin: Skin is warm and dry.  Psychiatric: She has a normal mood and affect. Her behavior is normal.    ED Course  Procedures (including critical care time) DIAGNOSTIC STUDIES: Oxygen Saturation is 100% on room air, normal by my interpretation.    COORDINATION OF CARE: At 3:52 PM: Discussed treatment plan with patient which includes a cat scan of the abdomen. Pt will be put on medication to treat sx. Pt is advised if sx persists or are not improving to return to the ER. Patient agrees.   Labs Review Labs Reviewed  URINALYSIS, ROUTINE W REFLEX MICROSCOPIC - Abnormal; Notable for the following:    Specific Gravity, Urine 1.033 (*)    Bilirubin Urine SMALL (*)    Ketones, ur 15 (*)    All other components within normal limits  CBC WITH DIFFERENTIAL - Abnormal; Notable for the following:    Lymphocytes Relative 48 (*)    All other components within normal limits  COMPREHENSIVE METABOLIC PANEL - Abnormal; Notable for the following:    Glucose, Bld 162 (*)    All other components within normal limits  LIPASE, BLOOD   Imaging Review Ct Abdomen Pelvis W Contrast  07/18/2013   CLINICAL DATA:  43 year old female with right abdominal and pelvic pain and vomiting.  EXAM: CT ABDOMEN AND PELVIS WITH CONTRAST  TECHNIQUE: Multidetector CT imaging of the abdomen and pelvis was performed using the standard protocol following bolus administration of intravenous contrast.  CONTRAST:  100 cc intravenous Omnipaque 300  COMPARISON:  09/27/2008 and prior CTs  FINDINGS: The liver, spleen, pancreas, adrenal glands, kidneys and gallbladder are unremarkable except for a left upper pole renal cyst.  There is no evidence of free fluid, enlarged lymph nodes, biliary dilation or abdominal aortic aneurysm.  The bowel, bladder and appendix are unremarkable. There is no evidence of bowel obstruction,  abscess or pneumoperitoneum. Patient is status post hysterectomy.  There is no evidence of adnexal mass. No acute or suspicious bony abnormalities are identified.  An implantable device with generator pack in the left gluteal subcutaneous tissues is noted and tip in the posterior left pelvis -  Correlate clinically.  IMPRESSION: No evidence of acute abnormality.  Status post hysterectomy. Implantable device with wire tip in the posterior left pelvis - unchanged but correlate clinically.   Electronically Signed   By: Laveda Abbe M.D.   On: 07/18/2013 17:42    EKG Interpretation   None       MDM   Final diagnoses:  Gastroenteritis   Discussed results with patient and discussed return precautions.  Hilario Quarry, MD 07/18/13 2029

## 2013-07-18 NOTE — ED Notes (Signed)
States that she is having pain in the LRQ, LBM 2/22. Vomiting Wed/Thur.

## 2013-07-18 NOTE — Discharge Instructions (Signed)
Viral Gastroenteritis Viral gastroenteritis is also known as stomach flu. This condition affects the stomach and intestinal tract. It can cause sudden diarrhea and vomiting. The illness typically lasts 3 to 8 days. Most people develop an immune response that eventually gets rid of the virus. While this natural response develops, the virus can make you quite ill. CAUSES  Many different viruses can cause gastroenteritis, such as rotavirus or noroviruses. You can catch one of these viruses by consuming contaminated food or water. You may also catch a virus by sharing utensils or other personal items with an infected person or by touching a contaminated surface. SYMPTOMS  The most common symptoms are diarrhea and vomiting. These problems can cause a severe loss of body fluids (dehydration) and a body salt (electrolyte) imbalance. Other symptoms may include:  Fever.  Headache.  Fatigue.  Abdominal pain. DIAGNOSIS  Your caregiver can usually diagnose viral gastroenteritis based on your symptoms and a physical exam. A stool sample may also be taken to test for the presence of viruses or other infections. TREATMENT  This illness typically goes away on its own. Treatments are aimed at rehydration. The most serious cases of viral gastroenteritis involve vomiting so severely that you are not able to keep fluids down. In these cases, fluids must be given through an intravenous line (IV). HOME CARE INSTRUCTIONS   Drink enough fluids to keep your urine clear or pale yellow. Drink small amounts of fluids frequently and increase the amounts as tolerated.  Ask your caregiver for specific rehydration instructions.  Avoid:  Foods high in sugar.  Alcohol.  Carbonated drinks.  Tobacco.  Juice.  Caffeine drinks.  Extremely hot or cold fluids.  Fatty, greasy foods.  Too much intake of anything at one time.  Dairy products until 24 to 48 hours after diarrhea stops.  You may consume probiotics.  Probiotics are active cultures of beneficial bacteria. They may lessen the amount and number of diarrheal stools in adults. Probiotics can be found in yogurt with active cultures and in supplements.  Wash your hands well to avoid spreading the virus.  Only take over-the-counter or prescription medicines for pain, discomfort, or fever as directed by your caregiver. Do not give aspirin to children. Antidiarrheal medicines are not recommended.  Ask your caregiver if you should continue to take your regular prescribed and over-the-counter medicines.  Keep all follow-up appointments as directed by your caregiver. SEEK IMMEDIATE MEDICAL CARE IF:   You are unable to keep fluids down.  You do not urinate at least once every 6 to 8 hours.  You develop shortness of breath.  You notice blood in your stool or vomit. This may look like coffee grounds.  You have abdominal pain that increases or is concentrated in one small area (localized).  You have persistent vomiting or diarrhea.  You have a fever.  The patient is a child younger than 3 months, and he or she has a fever.  The patient is a child older than 3 months, and he or she has a fever and persistent symptoms.  The patient is a child older than 3 months, and he or she has a fever and symptoms suddenly get worse.  The patient is a baby, and he or she has no tears when crying. MAKE SURE YOU:   Understand these instructions.  Will watch your condition.  Will get help right away if you are not doing well or get worse. Document Released: 05/13/2005 Document Revised: 08/05/2011 Document Reviewed: 02/27/2011   [employer] Patient Information 2014 [employer]  

## 2013-07-18 NOTE — ED Notes (Signed)
Went to check on patient, found her itching and c/o breaking out in hives.  Several hives noted to her back.  Dr. Rosalia Hammersay notified, order given for Benadryl.  Patient does verbalize feeling better after Benadryl administration.  Dr. Rosalia Hammersay has given the okay for patient to continue with CT abdomen/pelvis.

## 2013-07-18 NOTE — ED Notes (Signed)
Dr Ray at bedside. 

## 2014-01-01 ENCOUNTER — Emergency Department (HOSPITAL_BASED_OUTPATIENT_CLINIC_OR_DEPARTMENT_OTHER)
Admission: EM | Admit: 2014-01-01 | Discharge: 2014-01-02 | Disposition: A | Discharge status: Home / Self Care | Payer: Medicare Other | Attending: Emergency Medicine | Admitting: Emergency Medicine

## 2014-01-01 ENCOUNTER — Encounter (HOSPITAL_BASED_OUTPATIENT_CLINIC_OR_DEPARTMENT_OTHER): Payer: Self-pay | Admitting: Emergency Medicine

## 2014-01-01 ENCOUNTER — Emergency Department (HOSPITAL_BASED_OUTPATIENT_CLINIC_OR_DEPARTMENT_OTHER): Payer: Medicare Other

## 2014-01-01 DIAGNOSIS — Z90710 Acquired absence of both cervix and uterus: Secondary | ICD-10-CM | POA: Insufficient documentation

## 2014-01-01 DIAGNOSIS — E119 Type 2 diabetes mellitus without complications: Secondary | ICD-10-CM | POA: Insufficient documentation

## 2014-01-01 DIAGNOSIS — R1031 Right lower quadrant pain: Secondary | ICD-10-CM | POA: Insufficient documentation

## 2014-01-01 DIAGNOSIS — F17200 Nicotine dependence, unspecified, uncomplicated: Secondary | ICD-10-CM | POA: Diagnosis not present

## 2014-01-01 DIAGNOSIS — Z8669 Personal history of other diseases of the nervous system and sense organs: Secondary | ICD-10-CM | POA: Diagnosis not present

## 2014-01-01 DIAGNOSIS — Z9889 Other specified postprocedural states: Secondary | ICD-10-CM | POA: Insufficient documentation

## 2014-01-01 DIAGNOSIS — R300 Dysuria: Secondary | ICD-10-CM | POA: Diagnosis not present

## 2014-01-01 DIAGNOSIS — R630 Anorexia: Secondary | ICD-10-CM | POA: Insufficient documentation

## 2014-01-01 DIAGNOSIS — Z87448 Personal history of other diseases of urinary system: Secondary | ICD-10-CM | POA: Insufficient documentation

## 2014-01-01 DIAGNOSIS — G8929 Other chronic pain: Secondary | ICD-10-CM | POA: Diagnosis not present

## 2014-01-01 DIAGNOSIS — R197 Diarrhea, unspecified: Secondary | ICD-10-CM | POA: Insufficient documentation

## 2014-01-01 DIAGNOSIS — R110 Nausea: Secondary | ICD-10-CM | POA: Diagnosis not present

## 2014-01-01 LAB — URINALYSIS, ROUTINE W REFLEX MICROSCOPIC
Bilirubin Urine: NEGATIVE
GLUCOSE, UA: NEGATIVE mg/dL
Ketones, ur: 15 mg/dL — AB
Leukocytes, UA: NEGATIVE
Nitrite: NEGATIVE
Protein, ur: NEGATIVE mg/dL
SPECIFIC GRAVITY, URINE: 1.037 — AB (ref 1.005–1.030)
Urobilinogen, UA: 0.2 mg/dL (ref 0.0–1.0)
pH: 5.5 (ref 5.0–8.0)

## 2014-01-01 LAB — URINE MICROSCOPIC-ADD ON

## 2014-01-01 MED ORDER — IOHEXOL 300 MG/ML  SOLN: 100.0000 mL | Freq: Once | INTRAMUSCULAR | Status: AC | PRN

## 2014-01-01 MED ORDER — SODIUM CHLORIDE 0.9 % IV BOLUS (SEPSIS)
1000.0000 mL | Freq: Once | INTRAVENOUS | Status: AC
  Administered 2014-01-02: 1000 mL via INTRAVENOUS

## 2014-01-01 MED ORDER — HYDROMORPHONE HCL PF 1 MG/ML IJ SOLN
1.0000 mg | Freq: Once | INTRAMUSCULAR | Status: AC
  Administered 2014-01-01: 1 mg via INTRAVENOUS
  Filled 2014-01-01: qty 1

## 2014-01-01 MED ORDER — METHYLPREDNISOLONE SODIUM SUCC 125 MG IJ SOLR: 125.0000 mg | Freq: Once | INTRAMUSCULAR | Status: DC

## 2014-01-01 MED ORDER — SODIUM CHLORIDE 0.9 % IV BOLUS (SEPSIS)
1000.0000 mL | Freq: Once | INTRAVENOUS | Status: AC
  Administered 2014-01-01: 1000 mL via INTRAVENOUS

## 2014-01-01 MED ORDER — DIPHENHYDRAMINE HCL 50 MG/ML IJ SOLN: 25.0000 mg | Freq: Once | INTRAMUSCULAR | Status: DC

## 2014-01-01 MED ORDER — IOHEXOL 300 MG/ML  SOLN
50.0000 mL | Freq: Once | INTRAMUSCULAR | Status: AC | PRN
Start: 2014-01-01 — End: 2014-01-01

## 2014-01-01 NOTE — ED Notes (Signed)
Pt reports right lower abdominal pain and burning with urination

## 2014-01-02 LAB — CBC WITH DIFFERENTIAL/PLATELET
BASOS ABS: 0 10*3/uL (ref 0.0–0.1)
Basophils Relative: 0 % (ref 0–1)
Eosinophils Absolute: 0.2 10*3/uL (ref 0.0–0.7)
Eosinophils Relative: 2 % (ref 0–5)
HCT: 37.4 % (ref 36.0–46.0)
Hemoglobin: 13.1 g/dL (ref 12.0–15.0)
Lymphocytes Relative: 41 % (ref 12–46)
Lymphs Abs: 3.7 10*3/uL (ref 0.7–4.0)
MCH: 32 pg (ref 26.0–34.0)
MCHC: 35 g/dL (ref 30.0–36.0)
MCV: 91.2 fL (ref 78.0–100.0)
Monocytes Absolute: 0.6 10*3/uL (ref 0.1–1.0)
Monocytes Relative: 7 % (ref 3–12)
NEUTROS ABS: 4.6 10*3/uL (ref 1.7–7.7)
NEUTROS PCT: 51 % (ref 43–77)
Platelets: 200 10*3/uL (ref 150–400)
RBC: 4.1 MIL/uL (ref 3.87–5.11)
RDW: 12.5 % (ref 11.5–15.5)
WBC: 9.1 10*3/uL (ref 4.0–10.5)

## 2014-01-02 LAB — COMPREHENSIVE METABOLIC PANEL
ALBUMIN: 3.9 g/dL (ref 3.5–5.2)
ALT: 17 U/L (ref 0–35)
AST: 16 U/L (ref 0–37)
Alkaline Phosphatase: 44 U/L (ref 39–117)
Anion gap: 12 (ref 5–15)
BILIRUBIN TOTAL: 0.6 mg/dL (ref 0.3–1.2)
BUN: 19 mg/dL (ref 6–23)
CHLORIDE: 110 meq/L (ref 96–112)
CO2: 21 mEq/L (ref 19–32)
Calcium: 9 mg/dL (ref 8.4–10.5)
Creatinine, Ser: 0.7 mg/dL (ref 0.50–1.10)
GFR calc Af Amer: >90 mL/min (ref >90)
GFR calc non Af Amer: >90 mL/min (ref >90)
Glucose, Bld: 100 mg/dL — ABNORMAL HIGH (ref 70–99)
Potassium: 4 mEq/L (ref 3.7–5.3)
Sodium: 143 mEq/L (ref 137–147)
Total Protein: 6.8 g/dL (ref 6.0–8.3)

## 2014-01-02 LAB — I-STAT CG4 LACTIC ACID, ED: LACTIC ACID, VENOUS: 0.45 mmol/L — AB (ref 0.5–2.2)

## 2014-01-02 MED ORDER — FENTANYL CITRATE 0.05 MG/ML IJ SOLN
100.0000 ug | Freq: Once | INTRAMUSCULAR | Status: AC
  Administered 2014-01-02: 100 ug via INTRAVENOUS

## 2014-01-02 MED ORDER — IOHEXOL 300 MG/ML  SOLN
100.0000 mL | Freq: Once | INTRAMUSCULAR | Status: AC | PRN
  Administered 2014-01-02: 100 mL via INTRAVENOUS

## 2014-01-02 MED ORDER — HYDROCODONE-ACETAMINOPHEN 5-325 MG PO TABS: 1.0000 | ORAL_TABLET | Freq: Four times a day (QID) | ORAL | Status: DC | PRN

## 2014-01-02 MED ORDER — FENTANYL CITRATE 0.05 MG/ML IJ SOLN
INTRAMUSCULAR | Status: AC
  Administered 2014-01-02: 100 ug via INTRAVENOUS
  Filled 2014-01-02: qty 2

## 2014-01-02 MED ORDER — DIPHENHYDRAMINE HCL 50 MG/ML IJ SOLN
25.0000 mg | Freq: Once | INTRAMUSCULAR | Status: AC
  Administered 2014-01-02: 25 mg via INTRAVENOUS
  Filled 2014-01-02: qty 1

## 2014-01-02 NOTE — ED Notes (Addendum)
Labs drawn and sent Mrs Barbara Wu states pain is greatly reduced but itching continues Benadryl 25 mg IV given per order.

## 2014-01-02 NOTE — ED Provider Notes (Signed)
CSN: [id number]     Arrival date & time 01/01/14  1942 History   First MD Initiated Contact with Patient 01/01/14 2123     Chief Complaint  Patient presents with  . Abdominal Pain     (Consider location/radiation/quality/duration/timing/severity/associated sxs/prior Treatment) Patient is a 43 y.o. female presenting with abdominal pain. The history is provided by the patient and a relative. No language interpreter was used.  Abdominal Pain Pain location:  RLQ Pain quality: squeezing   Pain radiates to:  Does not radiate Pain severity:  Severe Onset quality:  Gradual Duration:  27 hours Timing:  Constant Progression:  Worsening Chronicity:  New Relieved by:  Nothing Worsened by:  Movement, palpation and position changes Ineffective treatments:  Acetaminophen and NSAIDs Associated symptoms: anorexia, diarrhea, dysuria (chronic, unchanged) and nausea   Associated symptoms: no chest pain, no chills, no constipation, no cough, no fatigue, no fever, no melena, no shortness of breath, no sore throat, no vaginal bleeding, no vaginal discharge and no vomiting   Diarrhea:    Quality:  Watery   Number of occurrences:  1   Severity:  Mild Risk factors: multiple surgeries and obesity   Risk factors: not pregnant and no recent hospitalization     Past Medical History  Diagnosis Date  . Interstitial cystitis   . RSD (reflex sympathetic dystrophy)   . PONV (postoperative nausea and vomiting)   . Diabetes mellitus without complication   . Tear of left acetabular labrum   . Chronic left hip pain   . PONV (postoperative nausea and vomiting)    Past Surgical History  Procedure Laterality Date  . Abdominal hysterectomy    . Cesarean section    . Tonsillectomy    . Anterior cruciate ligament repair    . Carpal tunnel release Bilateral   . Interstim implant placement  Feb 2010    bladder  . Hip arthroscopy Left 10/28/2012    Procedure: ARTHROSCOPY HIP- left;  Surgeon: Loreta Ave, MD;   Location: [facility] OR;  Service: Orthopedics;  Laterality: Left;   Family History  Problem Relation Age of Onset  . Seizures    . Stroke     History  Substance Use Topics  . Smoking status: Current Every Day Smoker -- 0.50 packs/day for 27 years  . Smokeless tobacco: Not on file  . Alcohol Use: No   OB History   Grav Para Term Preterm Abortions TAB SAB Ect Mult Living                 Review of Systems  Constitutional: Negative for fever, chills, diaphoresis, activity change, appetite change and fatigue.  HENT: Negative for congestion, facial swelling, rhinorrhea and sore throat.   Eyes: Negative for photophobia and discharge.  Respiratory: Negative for cough, chest tightness and shortness of breath.   Cardiovascular: Negative for chest pain, palpitations and leg swelling.  Gastrointestinal: Positive for nausea, abdominal pain, diarrhea and anorexia. Negative for vomiting, constipation and melena.  Endocrine: Negative for polydipsia and polyuria.  Genitourinary: Positive for dysuria (chronic, unchanged). Negative for frequency, vaginal bleeding, vaginal discharge, difficulty urinating and pelvic pain.  Musculoskeletal: Negative for arthralgias, back pain, neck pain and neck stiffness.  Skin: Negative for color change and wound.  Allergic/Immunologic: Negative for immunocompromised state.  Neurological: Negative for facial asymmetry, weakness, numbness and headaches.  Hematological: Does not bruise/bleed easily.  Psychiatric/Behavioral: Negative for confusion and agitation.      Allergies  Demerol; Morphine and related; Other; and Tape  Home Medications   Prior to Admission medications   Medication Sig Start Date End Date Taking? Authorizing Provider  HYDROcodone-acetaminophen (NORCO) 5-325 MG per tablet Take 2 tablets by mouth every 4 (four) hours as needed for pain. 03/05/13   Geoffery Lyonsouglas Delo, MD  metFORMIN (GLUCOPHAGE) 500 MG tablet Take 500 mg by mouth 2 (two) times daily with a  meal.    Historical Provider, MD  oxyCODONE-acetaminophen (ROXICET) 5-325 MG per tablet 1-2 tablets every 4-6 hrs as needed for pain 10/28/12   Kirstin J Shepperson, PA-C  promethazine (PHENERGAN) 25 MG tablet Take 1 tablet (25 mg total) by mouth every 6 (six) hours as needed for nausea or vomiting. 07/18/13   Hilario Quarryanielle S Ray, MD   BP 172/80  Pulse 75  Temp(Src) 98.1 F (36.7 C) (Oral)  Resp 20  Ht 5\' 1"  (1.549 m)  Wt 199 lb (90.266 kg)  BMI 37.62 kg/m2  SpO2 100% Physical Exam  Constitutional: She is oriented to person, place, and time. She appears well-developed and well-nourished. No distress.  HENT:  Head: Normocephalic and atraumatic.  Mouth/Throat: No oropharyngeal exudate.  Eyes: Pupils are equal, round, and reactive to light.  Neck: Normal range of motion. Neck supple.  Cardiovascular: Normal rate, regular rhythm and normal heart sounds.  Exam reveals no gallop and no friction rub.   No murmur heard. Pulmonary/Chest: Effort normal and breath sounds normal. No respiratory distress. She has no wheezes. She has no rales.  Abdominal: Soft. Bowel sounds are normal. She exhibits no distension and no mass. There is tenderness in the right lower quadrant. There is tenderness at McBurney's point. There is no rigidity, no rebound, no guarding, no CVA tenderness and negative Murphy's sign.  + obturator sign.   Musculoskeletal: Normal range of motion. She exhibits no edema and no tenderness.  Neurological: She is alert and oriented to person, place, and time.  Skin: Skin is warm and dry.  Psychiatric: She has a normal mood and affect.    ED Course  Procedures (including critical care time) Labs Review Labs Reviewed  URINALYSIS, ROUTINE W REFLEX MICROSCOPIC - Abnormal; Notable for the following:    Specific Gravity, Urine 1.037 (*)    Hgb urine dipstick TRACE (*)    Ketones, ur 15 (*)    All other components within normal limits  URINE MICROSCOPIC-ADD ON - Abnormal; Notable for the  following:    Squamous Epithelial / LPF FEW (*)    Bacteria, UA FEW (*)    All other components within normal limits  CBC WITH DIFFERENTIAL  COMPREHENSIVE METABOLIC PANEL  LACTIC ACID, PLASMA    Imaging Review No results found.   EKG Interpretation None      MDM   Final diagnoses:  None    Pt is a 43 y.o. female with Pmhx as above who presents with constant, worsening, squeezing RLQ pain without radiation since 9pm yesterday (about 27 hrs ago). Pain worse w/ change in position. She has had assoc nausea, one episode of vomiting. No fevers. She is s/p total hysterectomy. On PE, VSS, pt uncomfortable, but in NAD. +RLQ ttp w/o rebound or guarding. +McBurney's and +obutorator signs.  Urine unremarkable. CBC, CMP, LA ordered as well as CT ab/pelvis to r/o appendicitis.    Care transferred to Dr. Read DriversMolpus who will follow labs and imaging.         Toy CookeyMegan Docherty, MD 01/02/14 0030

## 2014-01-02 NOTE — ED Provider Notes (Signed)
Nursing notes and vitals signs, including pulse oximetry, reviewed.  Summary of this visit's results, reviewed by myself:  Labs:  Results for orders placed during the hospital encounter of 01/01/14 (from the past 24 hour(s))  URINALYSIS, ROUTINE W REFLEX MICROSCOPIC     Status: Abnormal   Collection Time    01/01/14  9:00 PM      Result Value Ref Range   Color, Urine YELLOW  YELLOW   APPearance CLEAR  CLEAR   Specific Gravity, Urine 1.037 (*) 1.005 - 1.030   pH 5.5  5.0 - 8.0   Glucose, UA NEGATIVE  NEGATIVE mg/dL   Hgb urine dipstick TRACE (*) NEGATIVE   Bilirubin Urine NEGATIVE  NEGATIVE   Ketones, ur 15 (*) NEGATIVE mg/dL   Protein, ur NEGATIVE  NEGATIVE mg/dL   Urobilinogen, UA 0.2  0.0 - 1.0 mg/dL   Nitrite NEGATIVE  NEGATIVE   Leukocytes, UA NEGATIVE  NEGATIVE  URINE MICROSCOPIC-ADD ON     Status: Abnormal   Collection Time    01/01/14  9:00 PM      Result Value Ref Range   Squamous Epithelial / LPF FEW (*) RARE   WBC, UA 0-2  <3 WBC/hpf   RBC / HPF 0-2  <3 RBC/hpf   Bacteria, UA FEW (*) RARE   Urine-Other MUCOUS PRESENT    CBC WITH DIFFERENTIAL     Status: None   Collection Time    01/02/14 12:31 AM      Result Value Ref Range   WBC 9.1  4.0 - 10.5 K/uL   RBC 4.10  3.87 - 5.11 MIL/uL   Hemoglobin 13.1  12.0 - 15.0 g/dL   HCT 16.1  09.6 - 04.5 %   MCV 91.2  78.0 - 100.0 fL   MCH 32.0  26.0 - 34.0 pg   MCHC 35.0  30.0 - 36.0 g/dL   RDW 40.9  81.1 - 91.4 %   Platelets 200  150 - 400 K/uL   Neutrophils Relative % 51  43 - 77 %   Neutro Abs 4.6  1.7 - 7.7 K/uL   Lymphocytes Relative 41  12 - 46 %   Lymphs Abs 3.7  0.7 - 4.0 K/uL   Monocytes Relative 7  3 - 12 %   Monocytes Absolute 0.6  0.1 - 1.0 K/uL   Eosinophils Relative 2  0 - 5 %   Eosinophils Absolute 0.2  0.0 - 0.7 K/uL   Basophils Relative 0  0 - 1 %   Basophils Absolute 0.0  0.0 - 0.1 K/uL  COMPREHENSIVE METABOLIC PANEL     Status: Abnormal   Collection Time    01/02/14 12:31 AM      Result Value  Ref Range   Sodium 143  137 - 147 mEq/L   Potassium 4.0  3.7 - 5.3 mEq/L   Chloride 110  96 - 112 mEq/L   CO2 21  19 - 32 mEq/L   Glucose, Bld 100 (*) 70 - 99 mg/dL   BUN 19  6 - 23 mg/dL   Creatinine, Ser 7.82  0.50 - 1.10 mg/dL   Calcium 9.0  8.4 - 95.6 mg/dL   Total Protein 6.8  6.0 - 8.3 g/dL   Albumin 3.9  3.5 - 5.2 g/dL   AST 16  0 - 37 U/L   ALT 17  0 - 35 U/L   Alkaline Phosphatase 44  39 - 117 U/L   Total Bilirubin 0.6  0.3 -  1.2 mg/dL   GFR calc non Af Amer >90  >90 mL/min   GFR calc Af Amer >90  >90 mL/min   Anion gap 12  5 - 15  I-STAT CG4 LACTIC ACID, ED     Status: Abnormal   Collection Time    01/02/14 12:43 AM      Result Value Ref Range   Lactic Acid, Venous 0.45 (*) 0.5 - 2.2 mmol/L    Imaging Studies: Ct Abdomen Pelvis W Contrast  01/02/2014   CLINICAL DATA:  RLQ ab pain  EXAM: CT ABDOMEN AND PELVIS WITH CONTRAST  TECHNIQUE: Multidetector CT imaging of the abdomen and pelvis was performed using the standard protocol following bolus administration of intravenous contrast.  CONTRAST:  100mL OMNIPAQUE IOHEXOL 300 MG/ML  SOLN  COMPARISON:  Prior CT from 07/18/2013  FINDINGS: Mild subsegmental atelectasis seen dependently within the visualized lung bases. No pleural or pericardial effusion.  The liver demonstrates a normal contrast enhanced appearance. Gallbladder within normal limits. No biliary ductal dilatation. The spleen, renal glands, and pancreas demonstrate a normal contrast enhanced appearance.  Kidneys are equal in size with symmetric enhancement. No nephrolithiasis, hydronephrosis, or focal enhancing renal mass. Simple 1.8 cm cyst present within the upper pole left kidney.  Stomach within normal limits. No evidence of bowel obstruction. Appendix is well visualized in the right lower quadrant and is of normal caliber and appearance without associated inflammatory changes to suggest acute appendicitis. No abnormal wall thickening, mucosal enhancement, or inflammatory  stranding seen about the bowels.  Bladder is largely decompressed but grossly normal. Uterus and ovaries are not visualized, compatible with history of prior hysterectomy.  No free air or fluid. No pathologically enlarged intra-abdominal pelvic lymph nodes. Normal intravascular enhancement seen throughout the abdomen and pelvis. Generator for sacral stimulator device present within the subcutaneous fat of the left flank. Electrode terminates in the left pelvic cul-de-sac.  No acute osseous abnormality. No worrisome lytic or blastic osseous lesions.  IMPRESSION: 1. No CT evidence of acute intra-abdominal pelvic process. 2. Normal appendix. 3. Status post hysterectomy.   Electronically Signed   By: Barbara Wu  McClintock M.D.   On: 01/02/2014 01:58   2:22 AM Patient's pain improved with pain medication but continues to have lateral right lower quadrant tenderness. Patient was advised a CT of lab findings. We will treat her symptomatically and advised to return for worsening or changing symptoms. She has a gastroenterologist Dr. Loreta AveMann whom she was advised to contact tomorrow.    Hanley SeamenJohn L Icy Fuhrmann, MD 01/02/14 [phone]

## 2014-01-02 NOTE — Discharge Instructions (Signed)

## 2014-01-04 ENCOUNTER — Other Ambulatory Visit: Payer: Self-pay | Admitting: Gastroenterology

## 2014-01-04 DIAGNOSIS — R112 Nausea with vomiting, unspecified: Secondary | ICD-10-CM

## 2014-01-04 DIAGNOSIS — R1031 Right lower quadrant pain: Secondary | ICD-10-CM

## 2014-01-07 ENCOUNTER — Ambulatory Visit (HOSPITAL_COMMUNITY)
Admission: RE | Admit: 2014-01-07 | Discharge: 2014-01-07 | Disposition: A | Discharge status: Home / Self Care | Payer: Medicare Other | Source: Ambulatory Visit | Attending: Gastroenterology | Admitting: Gastroenterology

## 2014-01-07 DIAGNOSIS — R1031 Right lower quadrant pain: Secondary | ICD-10-CM | POA: Insufficient documentation

## 2014-01-07 DIAGNOSIS — R112 Nausea with vomiting, unspecified: Secondary | ICD-10-CM | POA: Diagnosis not present

## 2014-01-11 ENCOUNTER — Encounter (HOSPITAL_COMMUNITY)
Admission: RE | Admit: 2014-01-11 | Discharge: 2014-01-11 | Disposition: A | Discharge status: Home / Self Care | Payer: Medicare Other | Source: Ambulatory Visit | Attending: Gastroenterology | Admitting: Gastroenterology

## 2014-01-11 DIAGNOSIS — R112 Nausea with vomiting, unspecified: Secondary | ICD-10-CM | POA: Insufficient documentation

## 2014-01-11 DIAGNOSIS — R1031 Right lower quadrant pain: Secondary | ICD-10-CM | POA: Diagnosis present

## 2014-01-11 MED ORDER — SINCALIDE 5 MCG IJ SOLR
INTRAMUSCULAR | Status: AC
  Filled 2014-01-11: qty 5

## 2014-01-11 MED ORDER — TECHNETIUM TC 99M MEBROFENIN IV KIT
5.0000 | PACK | Freq: Once | INTRAVENOUS | Status: AC | PRN
  Administered 2014-01-11: 5 via INTRAVENOUS

## 2014-01-11 MED ORDER — SINCALIDE 5 MCG IJ SOLR
0.0200 ug/kg | Freq: Once | INTRAMUSCULAR | Status: AC
  Administered 2014-01-11: 08:00:00 via INTRAVENOUS

## 2014-01-21 ENCOUNTER — Encounter (INDEPENDENT_AMBULATORY_CARE_PROVIDER_SITE_OTHER): Payer: Self-pay | Admitting: Surgery

## 2014-01-21 ENCOUNTER — Ambulatory Visit (INDEPENDENT_AMBULATORY_CARE_PROVIDER_SITE_OTHER): Payer: Medicare Other | Admitting: Surgery

## 2014-01-21 VITALS — BP 122/70 | HR 78 | Temp 97.2°F | Ht 61.0 in | Wt 202.0 lb

## 2014-01-21 DIAGNOSIS — K828 Other specified diseases of gallbladder: Secondary | ICD-10-CM | POA: Insufficient documentation

## 2014-01-21 NOTE — Patient Instructions (Signed)
Our office will call you back to schedule surgery

## 2014-01-21 NOTE — Progress Notes (Signed)
Patient ID: Barbara Wu, female   DOB: 02/16/1971, 43 y.o.   MRN: [id number]  Chief Complaint  Patient presents with  . eval gallbladder    HPI Barbara Wu is a 43 y.o. female.   HPI This is a pleasant female referred by Dr. Loreta Ave for evaluation of abdominal pain. For 3 weeks she has been having moderate to severe right upper quadrant abdominal pain, bloating, and nausea. It is constant and worsening. She has no emesis. She actually had a similar attack last October. This is worse with fatty meals.  Bowel movements are unremarkable. The pain is described as sharp Past Medical History  Diagnosis Date  . Interstitial cystitis   . RSD (reflex sympathetic dystrophy)   . PONV (postoperative nausea and vomiting)   . Diabetes mellitus without complication   . Tear of left acetabular labrum   . Chronic left hip pain   . PONV (postoperative nausea and vomiting)   . Arthritis     Past Surgical History  Procedure Laterality Date  . Abdominal hysterectomy    . Cesarean section    . Tonsillectomy    . Anterior cruciate ligament repair    . Carpal tunnel release Bilateral   . Interstim implant placement  Feb 2010    bladder  . Hip arthroscopy Left 10/28/2012    Procedure: ARTHROSCOPY HIP- left;  Surgeon: Loreta Ave, MD;  Location: [facility] OR;  Service: Orthopedics;  Laterality: Left;    Family History  Problem Relation Age of Onset  . Seizures    . Stroke      Social History History  Substance Use Topics  . Smoking status: Current Every Day Smoker -- 0.50 packs/day for 27 years  . Smokeless tobacco: Not on file  . Alcohol Use: No    Allergies  Allergen Reactions  . Demerol Hives  . Morphine And Related Hives  . Other Hives    Eggplant allergy  . Tape Other (See Comments)    Band-aid (leaves a burn spot on skin)    Current Outpatient Prescriptions  Medication Sig Dispense Refill  . HYDROcodone-acetaminophen (NORCO) 5-325 MG per tablet Take 2 tablets by mouth every 4  (four) hours as needed for pain.  10 tablet  0  . HYDROcodone-acetaminophen (NORCO/VICODIN) 5-325 MG per tablet Take 1-2 tablets by mouth every 6 (six) hours as needed.  20 tablet  0  . metFORMIN (GLUCOPHAGE) 500 MG tablet Take 500 mg by mouth 2 (two) times daily with a meal.      . oxyCODONE-acetaminophen (ROXICET) 5-325 MG per tablet 1-2 tablets every 4-6 hrs as needed for pain  40 tablet  0  . promethazine (PHENERGAN) 25 MG tablet Take 1 tablet (25 mg total) by mouth every 6 (six) hours as needed for nausea or vomiting.  30 tablet  0   No current facility-administered medications for this visit.    Review of Systems Review of Systems  Constitutional: Negative for fever, chills and unexpected weight change.  HENT: Negative for congestion, hearing loss, sore throat, trouble swallowing and voice change.   Eyes: Negative for visual disturbance.  Respiratory: Negative for cough and wheezing.   Cardiovascular: Negative for chest pain, palpitations and leg swelling.  Gastrointestinal: Positive for nausea, abdominal pain and abdominal distention. Negative for vomiting, diarrhea, constipation, blood in stool and anal bleeding.  Genitourinary: Negative for hematuria, vaginal bleeding and difficulty urinating.  Musculoskeletal: Negative for arthralgias.  Skin: Negative for rash and wound.  Neurological: Negative  for seizures, syncope and headaches.  Hematological: Negative for adenopathy. Does not bruise/bleed easily.  Psychiatric/Behavioral: Negative for confusion.    Blood pressure 122/70, pulse 78, temperature 97.2 F (36.2 C), height  (1.549 m), weight 202 lb (91.627 kg).  Physical Exam Physical Exam  Constitutional: She is oriented to person, place, and time. She appears well-developed and well-nourished. No distress.  HENT:  Head: Normocephalic and atraumatic.  Right Ear: External ear normal.  Left Ear: External ear normal.  Nose: Nose normal.  Mouth/Throat: Oropharynx is clear  and moist. No oropharyngeal exudate.  Eyes: Conjunctivae are normal. Pupils are equal, round, and reactive to light. Right eye exhibits no discharge. Left eye exhibits no discharge. No scleral icterus.  Neck: Normal range of motion. Neck supple. No tracheal deviation present. No thyromegaly present.  Cardiovascular: Normal rate, regular rhythm, normal heart sounds and intact distal pulses.   No murmur heard. Pulmonary/Chest: Effort normal and breath sounds normal. No respiratory distress. She has no wheezes.  Abdominal: Soft. Bowel sounds are normal. She exhibits no distension. There is tenderness. There is guarding.  There is moderate tenderness with guarding in the right upper quadrant  Musculoskeletal: Normal range of motion. She exhibits no edema and no tenderness.  Lymphadenopathy:    She has no cervical adenopathy.  Neurological: She is alert and oriented to person, place, and time.  Skin: Skin is warm and dry. No rash noted. She is not diaphoretic. No erythema.  Psychiatric: Her behavior is normal. Judgment normal.    Data Reviewed Her gallbladder ejection fraction is 6.7%. She has a normal ultrasound, CAT scan, and upper endoscopy  Assessment    Biliary dyskinesia     Plan    I do suspect she might have some chronic cholecystitis as well. Laparoscopic cholecystectomy was recommended. I discussed the procedure in detail.  The patient was given Agricultural engineer.  We discussed the risks and benefits of a laparoscopic cholecystectomy and possible cholangiogram including, but not limited to bleeding, infection, injury to surrounding structures such as the intestine or liver, bile leak, retained gallstones, need to convert to an open procedure, prolonged diarrhea, blood clots such as  DVT, common bile duct injury, anesthesia risks, and possible need for additional procedures.  The likelihood of improvement in symptoms and return to the patient's normal status is good. We discussed the  typical post-operative recovery course.  I also discussed the possibility that this may not improve her symptoms.        Raedyn Wenke A 01/21/2014, 9:28 AM

## 2014-01-24 ENCOUNTER — Encounter (HOSPITAL_COMMUNITY): Payer: Self-pay | Admitting: Pharmacy Technician

## 2014-01-24 ENCOUNTER — Other Ambulatory Visit (HOSPITAL_COMMUNITY): Payer: Self-pay | Admitting: *Deleted

## 2014-01-24 ENCOUNTER — Encounter (HOSPITAL_COMMUNITY): Payer: Self-pay | Admitting: *Deleted

## 2014-01-24 NOTE — H&P (Signed)
Chief Complaint   Patient presents with   .  eval gallbladder   HPI  Barbara Wu is a 43 y.o. female.  HPI  This is a pleasant female referred by Dr. Loreta Ave for evaluation of abdominal pain. For 3 weeks she has been having moderate to severe right upper quadrant abdominal pain, bloating, and nausea. It is constant and worsening. She has no emesis. She actually had a similar attack last October. This is worse with fatty meals. Bowel movements are unremarkable. The pain is described as sharp  Past Medical History   Diagnosis  Date   .  Interstitial cystitis    .  RSD (reflex sympathetic dystrophy)    .  PONV (postoperative nausea and vomiting)    .  Diabetes mellitus without complication    .  Tear of left acetabular labrum    .  Chronic left hip pain    .  PONV (postoperative nausea and vomiting)    .  Arthritis     Past Surgical History   Procedure  Laterality  Date   .  Abdominal hysterectomy     .  Cesarean section     .  Tonsillectomy     .  Anterior cruciate ligament repair     .  Carpal tunnel release  Bilateral    .  Interstim implant placement   Feb 2010     bladder   .  Hip arthroscopy  Left  10/28/2012     Procedure: ARTHROSCOPY HIP- left; Surgeon: Loreta Ave, MD; Location: [facility] OR; Service: Orthopedics; Laterality: Left;    Family History   Problem  Relation  Age of Onset   .  Seizures     .  Stroke     Social History  History   Substance Use Topics   .  Smoking status:  Current Every Day Smoker -- 0.50 packs/day for 27 years   .  Smokeless tobacco:  Not on file   .  Alcohol Use:  No    Allergies   Allergen  Reactions   .  Demerol  Hives   .  Morphine And Related  Hives   .  Other  Hives     Eggplant allergy   .  Tape  Other (See Comments)     Band-aid (leaves a burn spot on skin)    Current Outpatient Prescriptions   Medication  Sig  Dispense  Refill   .  HYDROcodone-acetaminophen (NORCO) 5-325 MG per tablet  Take 2 tablets by mouth every 4 (four)  hours as needed for pain.  10 tablet  0   .  HYDROcodone-acetaminophen (NORCO/VICODIN) 5-325 MG per tablet  Take 1-2 tablets by mouth every 6 (six) hours as needed.  20 tablet  0   .  metFORMIN (GLUCOPHAGE) 500 MG tablet  Take 500 mg by mouth 2 (two) times daily with a meal.     .  oxyCODONE-acetaminophen (ROXICET) 5-325 MG per tablet  1-2 tablets every 4-6 hrs as needed for pain  40 tablet  0   .  promethazine (PHENERGAN) 25 MG tablet  Take 1 tablet (25 mg total) by mouth every 6 (six) hours as needed for nausea or vomiting.  30 tablet  0    No current facility-administered medications for this visit.   Review of Systems  Review of Systems  Constitutional: Negative for fever, chills and unexpected weight change.  HENT: Negative for congestion, hearing loss, sore throat, trouble swallowing  and voice change.  Eyes: Negative for visual disturbance.  Respiratory: Negative for cough and wheezing.  Cardiovascular: Negative for chest pain, palpitations and leg swelling.  Gastrointestinal: Positive for nausea, abdominal pain and abdominal distention. Negative for vomiting, diarrhea, constipation, blood in stool and anal bleeding.  Genitourinary: Negative for hematuria, vaginal bleeding and difficulty urinating.  Musculoskeletal: Negative for arthralgias.  Skin: Negative for rash and wound.  Neurological: Negative for seizures, syncope and headaches.  Hematological: Negative for adenopathy. Does not bruise/bleed easily.  Psychiatric/Behavioral: Negative for confusion.  Blood pressure 122/70, pulse 78, temperature 97.2 F (36.2 C), height  (1.549 m), weight 202 lb (91.627 kg).  Physical Exam  Physical Exam  Constitutional: She is oriented to person, place, and time. She appears well-developed and well-nourished. No distress.  HENT:  Head: Normocephalic and atraumatic.  Right Ear: External ear normal.  Left Ear: External ear normal.  Nose: Nose normal.  Mouth/Throat: Oropharynx is clear and  moist. No oropharyngeal exudate.  Eyes: Conjunctivae are normal. Pupils are equal, round, and reactive to light. Right eye exhibits no discharge. Left eye exhibits no discharge. No scleral icterus.  Neck: Normal range of motion. Neck supple. No tracheal deviation present. No thyromegaly present.  Cardiovascular: Normal rate, regular rhythm, normal heart sounds and intact distal pulses.  No murmur heard.  Pulmonary/Chest: Effort normal and breath sounds normal. No respiratory distress. She has no wheezes.  Abdominal: Soft. Bowel sounds are normal. She exhibits no distension. There is tenderness. There is guarding.  There is moderate tenderness with guarding in the right upper quadrant  Musculoskeletal: Normal range of motion. She exhibits no edema and no tenderness.  Lymphadenopathy:  She has no cervical adenopathy.  Neurological: She is alert and oriented to person, place, and time.  Skin: Skin is warm and dry. No rash noted. She is not diaphoretic. No erythema.  Psychiatric: Her behavior is normal. Judgment normal.  Data Reviewed  Her gallbladder ejection fraction is 6.7%. She has a normal ultrasound, CAT scan, and upper endoscopy  Assessment  Biliary dyskinesia  Plan  I do suspect she might have some chronic cholecystitis as well. Laparoscopic cholecystectomy was recommended. I discussed the procedure in detail. The patient was given Agricultural engineer. We discussed the risks and benefits of a laparoscopic cholecystectomy and possible cholangiogram including, but not limited to bleeding, infection, injury to surrounding structures such as the intestine or liver, bile leak, retained gallstones, need to convert to an open procedure, prolonged diarrhea, blood clots such as DVT, common bile duct injury, anesthesia risks, and possible need for additional procedures. The likelihood of improvement in symptoms and return to the patient's normal status is good. We discussed the typical post-operative  recovery course. I also discussed the possibility that this may not improve her symptoms.

## 2014-01-25 ENCOUNTER — Encounter (HOSPITAL_COMMUNITY): Payer: Medicare Other | Admitting: Anesthesiology

## 2014-01-25 ENCOUNTER — Encounter (HOSPITAL_COMMUNITY)
Admission: RE | Disposition: A | Discharge status: Home / Self Care | Payer: Self-pay | Source: Ambulatory Visit | Attending: Surgery

## 2014-01-25 ENCOUNTER — Ambulatory Visit (HOSPITAL_COMMUNITY): Payer: Medicare Other | Admitting: Anesthesiology

## 2014-01-25 ENCOUNTER — Ambulatory Visit (HOSPITAL_COMMUNITY)
Admission: RE | Admit: 2014-01-25 | Discharge: 2014-01-25 | Disposition: A | Discharge status: Home / Self Care | Payer: Medicare Other | Source: Ambulatory Visit | Attending: Surgery | Admitting: Surgery

## 2014-01-25 ENCOUNTER — Encounter (HOSPITAL_COMMUNITY): Payer: Self-pay | Admitting: *Deleted

## 2014-01-25 DIAGNOSIS — N3010 Interstitial cystitis (chronic) without hematuria: Secondary | ICD-10-CM | POA: Diagnosis not present

## 2014-01-25 DIAGNOSIS — Z885 Allergy status to narcotic agent status: Secondary | ICD-10-CM | POA: Diagnosis not present

## 2014-01-25 DIAGNOSIS — F17200 Nicotine dependence, unspecified, uncomplicated: Secondary | ICD-10-CM | POA: Insufficient documentation

## 2014-01-25 DIAGNOSIS — Z90710 Acquired absence of both cervix and uterus: Secondary | ICD-10-CM | POA: Diagnosis not present

## 2014-01-25 DIAGNOSIS — M25559 Pain in unspecified hip: Secondary | ICD-10-CM | POA: Insufficient documentation

## 2014-01-25 DIAGNOSIS — E119 Type 2 diabetes mellitus without complications: Secondary | ICD-10-CM | POA: Insufficient documentation

## 2014-01-25 DIAGNOSIS — K828 Other specified diseases of gallbladder: Secondary | ICD-10-CM | POA: Diagnosis present

## 2014-01-25 DIAGNOSIS — G8929 Other chronic pain: Secondary | ICD-10-CM | POA: Insufficient documentation

## 2014-01-25 DIAGNOSIS — M129 Arthropathy, unspecified: Secondary | ICD-10-CM | POA: Diagnosis not present

## 2014-01-25 DIAGNOSIS — K811 Chronic cholecystitis: Secondary | ICD-10-CM | POA: Diagnosis not present

## 2014-01-25 DIAGNOSIS — Z888 Allergy status to other drugs, medicaments and biological substances status: Secondary | ICD-10-CM | POA: Diagnosis not present

## 2014-01-25 DIAGNOSIS — G9059 Complex regional pain syndrome I of other specified site: Secondary | ICD-10-CM | POA: Diagnosis not present

## 2014-01-25 DIAGNOSIS — Z91041 Radiographic dye allergy status: Secondary | ICD-10-CM | POA: Diagnosis not present

## 2014-01-25 HISTORY — DX: Other specified diseases of gallbladder: K82.8

## 2014-01-25 HISTORY — DX: Fibromyalgia: M79.7

## 2014-01-25 HISTORY — DX: Personal history of urinary calculi: Z87.442

## 2014-01-25 HISTORY — DX: Gastro-esophageal reflux disease without esophagitis: K21.9

## 2014-01-25 HISTORY — PX: CHOLECYSTECTOMY: SHX55

## 2014-01-25 LAB — GLUCOSE, CAPILLARY
Glucose-Capillary: 91 mg/dL (ref 70–99)
Glucose-Capillary: 95 mg/dL (ref 70–99)

## 2014-01-25 SURGERY — LAPAROSCOPIC CHOLECYSTECTOMY
Anesthesia: General

## 2014-01-25 MED ORDER — PROPOFOL 10 MG/ML IV BOLUS
INTRAVENOUS | Status: DC | PRN
  Administered 2014-01-25: 150 mg via INTRAVENOUS

## 2014-01-25 MED ORDER — DEXAMETHASONE SODIUM PHOSPHATE 10 MG/ML IJ SOLN
INTRAMUSCULAR | Status: AC
  Filled 2014-01-25: qty 1

## 2014-01-25 MED ORDER — SODIUM CHLORIDE 0.9 % IJ SOLN: 3.0000 mL | Freq: Two times a day (BID) | INTRAMUSCULAR | Status: DC

## 2014-01-25 MED ORDER — FENTANYL CITRATE 0.05 MG/ML IJ SOLN
INTRAMUSCULAR | Status: DC | PRN
  Administered 2014-01-25 (×2): 50 ug via INTRAVENOUS
  Administered 2014-01-25: 100 ug via INTRAVENOUS
  Administered 2014-01-25: 50 ug via INTRAVENOUS

## 2014-01-25 MED ORDER — CEFAZOLIN SODIUM-DEXTROSE 2-3 GM-% IV SOLR
2.0000 g | INTRAVENOUS | Status: AC
  Administered 2014-01-25: 2 g via INTRAVENOUS

## 2014-01-25 MED ORDER — NEOSTIGMINE METHYLSULFATE 10 MG/10ML IV SOLN
INTRAVENOUS | Status: DC | PRN
  Administered 2014-01-25: 4 mg via INTRAVENOUS

## 2014-01-25 MED ORDER — ACETAMINOPHEN 650 MG RE SUPP
650.0000 mg | RECTAL | Status: DC | PRN
  Filled 2014-01-25: qty 1

## 2014-01-25 MED ORDER — DEXAMETHASONE SODIUM PHOSPHATE 10 MG/ML IJ SOLN
INTRAMUSCULAR | Status: DC | PRN
  Administered 2014-01-25: 10 mg via INTRAVENOUS

## 2014-01-25 MED ORDER — CEFAZOLIN SODIUM-DEXTROSE 2-3 GM-% IV SOLR
INTRAVENOUS | Status: AC
  Filled 2014-01-25: qty 50

## 2014-01-25 MED ORDER — BUPIVACAINE-EPINEPHRINE (PF) 0.25% -1:200000 IJ SOLN
INTRAMUSCULAR | Status: AC
  Filled 2014-01-25: qty 30

## 2014-01-25 MED ORDER — MIDAZOLAM HCL 2 MG/2ML IJ SOLN
INTRAMUSCULAR | Status: AC
  Filled 2014-01-25: qty 2

## 2014-01-25 MED ORDER — FENTANYL CITRATE 0.05 MG/ML IJ SOLN
INTRAMUSCULAR | Status: AC
  Filled 2014-01-25: qty 5

## 2014-01-25 MED ORDER — ONDANSETRON HCL 4 MG/2ML IJ SOLN
INTRAMUSCULAR | Status: AC
  Filled 2014-01-25: qty 2

## 2014-01-25 MED ORDER — LACTATED RINGERS IV SOLN
INTRAVENOUS | Status: DC | PRN
  Administered 2014-01-25: 14:00:00 via INTRAVENOUS

## 2014-01-25 MED ORDER — KETOROLAC TROMETHAMINE 30 MG/ML IJ SOLN
15.0000 mg | Freq: Once | INTRAMUSCULAR | Status: AC | PRN
  Administered 2014-01-25: 30 mg via INTRAVENOUS

## 2014-01-25 MED ORDER — HYDROMORPHONE HCL PF 1 MG/ML IJ SOLN
INTRAMUSCULAR | Status: AC
  Filled 2014-01-25: qty 1

## 2014-01-25 MED ORDER — GLYCOPYRROLATE 0.2 MG/ML IJ SOLN
INTRAMUSCULAR | Status: DC | PRN
  Administered 2014-01-25: .8 mg via INTRAVENOUS
  Administered 2014-01-25: 0.2 mg via INTRAVENOUS

## 2014-01-25 MED ORDER — SODIUM CHLORIDE 0.9 % IJ SOLN
3.0000 mL | INTRAMUSCULAR | Status: DC | PRN
Start: 2014-01-25 — End: 2014-01-25

## 2014-01-25 MED ORDER — MIDAZOLAM HCL 5 MG/5ML IJ SOLN
INTRAMUSCULAR | Status: DC | PRN
  Administered 2014-01-25: 2 mg via INTRAVENOUS

## 2014-01-25 MED ORDER — BUPIVACAINE HCL (PF) 0.25 % IJ SOLN
INTRAMUSCULAR | Status: DC | PRN
  Administered 2014-01-25: 20 mL

## 2014-01-25 MED ORDER — FENTANYL CITRATE 0.05 MG/ML IJ SOLN: 25.0000 ug | INTRAMUSCULAR | Status: DC | PRN

## 2014-01-25 MED ORDER — ROCURONIUM BROMIDE 100 MG/10ML IV SOLN
INTRAVENOUS | Status: DC | PRN
  Administered 2014-01-25: 50 mg via INTRAVENOUS

## 2014-01-25 MED ORDER — LIDOCAINE HCL (CARDIAC) 20 MG/ML IV SOLN
INTRAVENOUS | Status: DC | PRN
  Administered 2014-01-25: 60 mg via INTRAVENOUS

## 2014-01-25 MED ORDER — HYDROMORPHONE HCL PF 1 MG/ML IJ SOLN
0.2500 mg | INTRAMUSCULAR | Status: DC | PRN
  Administered 2014-01-25: 0.5 mg via INTRAVENOUS
  Administered 2014-01-25 (×4): 0.25 mg via INTRAVENOUS
  Administered 2014-01-25: 0.5 mg via INTRAVENOUS

## 2014-01-25 MED ORDER — DIPHENHYDRAMINE HCL 50 MG/ML IJ SOLN
INTRAMUSCULAR | Status: AC
  Filled 2014-01-25: qty 1

## 2014-01-25 MED ORDER — ACETAMINOPHEN 325 MG PO TABS: 650.0000 mg | ORAL_TABLET | ORAL | Status: DC | PRN

## 2014-01-25 MED ORDER — ONDANSETRON HCL 4 MG/2ML IJ SOLN
INTRAMUSCULAR | Status: DC | PRN
  Administered 2014-01-25: 4 mg via INTRAVENOUS

## 2014-01-25 MED ORDER — PROMETHAZINE HCL 25 MG/ML IJ SOLN: 6.2500 mg | INTRAMUSCULAR | Status: DC | PRN

## 2014-01-25 MED ORDER — KETOROLAC TROMETHAMINE 30 MG/ML IJ SOLN
INTRAMUSCULAR | Status: AC
  Filled 2014-01-25: qty 1

## 2014-01-25 MED ORDER — DIPHENHYDRAMINE HCL 50 MG/ML IJ SOLN
12.5000 mg | INTRAMUSCULAR | Status: DC | PRN
  Administered 2014-01-25: 12.5 mg via INTRAVENOUS

## 2014-01-25 MED ORDER — HYDROCODONE-ACETAMINOPHEN 5-325 MG PO TABS: 1.0000 | ORAL_TABLET | ORAL | Status: DC | PRN

## 2014-01-25 MED ORDER — LIDOCAINE HCL (CARDIAC) 20 MG/ML IV SOLN
INTRAVENOUS | Status: AC
  Filled 2014-01-25: qty 5

## 2014-01-25 MED ORDER — SODIUM CHLORIDE 0.9 % IV SOLN: 250.0000 mL | INTRAVENOUS | Status: DC | PRN

## 2014-01-25 MED ORDER — PROPOFOL 10 MG/ML IV BOLUS
INTRAVENOUS | Status: AC
  Filled 2014-01-25: qty 20

## 2014-01-25 MED ORDER — OXYCODONE HCL 5 MG PO TABS
5.0000 mg | ORAL_TABLET | ORAL | Status: DC | PRN
  Administered 2014-01-25: 5 mg via ORAL
  Filled 2014-01-25: qty 1

## 2014-01-25 MED ORDER — GLYCOPYRROLATE 0.2 MG/ML IJ SOLN
INTRAMUSCULAR | Status: AC
  Filled 2014-01-25: qty 4

## 2014-01-25 SURGICAL SUPPLY — 38 items
ADH SKN CLS APL DERMABOND .7 (GAUZE/BANDAGES/DRESSINGS) ×1
APL SKNCLS STERI-STRIP NONHPOA (GAUZE/BANDAGES/DRESSINGS) ×1
APPLIER CLIP 5 13 M/L LIGAMAX5 (MISCELLANEOUS) ×3
APR CLP MED LRG 5 ANG JAW (MISCELLANEOUS) ×1
BAG SPEC RTRVL LRG 6X4 10 (ENDOMECHANICALS) ×1
BANDAGE ADH SHEER 1  50/CT (GAUZE/BANDAGES/DRESSINGS) ×12 IMPLANT
BENZOIN TINCTURE PRP APPL 2/3 (GAUZE/BANDAGES/DRESSINGS) ×3 IMPLANT
CANISTER SUCTION 2500CC (MISCELLANEOUS) ×3 IMPLANT
CHLORAPREP W/TINT 26ML (MISCELLANEOUS) ×3 IMPLANT
CLIP APPLIE 5 13 M/L LIGAMAX5 (MISCELLANEOUS) ×1 IMPLANT
CLOSURE WOUND 1/2 X4 (GAUZE/BANDAGES/DRESSINGS) ×1
COVER MAYO STAND STRL (DRAPES) IMPLANT
DECANTER SPIKE VIAL GLASS SM (MISCELLANEOUS) ×3 IMPLANT
DERMABOND ADVANCED (GAUZE/BANDAGES/DRESSINGS) ×2
DERMABOND ADVANCED .7 DNX12 (GAUZE/BANDAGES/DRESSINGS) IMPLANT
DRAPE C-ARM 42X120 X-RAY (DRAPES) IMPLANT
DRAPE LAPAROSCOPIC ABDOMINAL (DRAPES) ×3 IMPLANT
DRAPE UTILITY XL STRL (DRAPES) ×3 IMPLANT
ELECT REM PT RETURN 9FT ADLT (ELECTROSURGICAL) ×3
ELECTRODE REM PT RTRN 9FT ADLT (ELECTROSURGICAL) ×1 IMPLANT
GLOVE SURG SIGNA 7.5 PF LTX (GLOVE) ×3 IMPLANT
GOWN STRL REUS W/TWL XL LVL3 (GOWN DISPOSABLE) ×6 IMPLANT
HEMOSTAT SURGICEL 4X8 (HEMOSTASIS) IMPLANT
KIT BASIN OR (CUSTOM PROCEDURE TRAY) ×3 IMPLANT
POUCH SPECIMEN RETRIEVAL 10MM (ENDOMECHANICALS) ×3 IMPLANT
SCISSORS LAP 5X35 DISP (ENDOMECHANICALS) ×2 IMPLANT
SET CHOLANGIOGRAPH MIX (MISCELLANEOUS) IMPLANT
SET IRRIG TUBING LAPAROSCOPIC (IRRIGATION / IRRIGATOR) ×3 IMPLANT
SOLUTION ANTI FOG 6CC (MISCELLANEOUS) ×3 IMPLANT
STRIP CLOSURE SKIN 1/2X4 (GAUZE/BANDAGES/DRESSINGS) ×2 IMPLANT
SUT MNCRL AB 4-0 PS2 18 (SUTURE) ×3 IMPLANT
TOWEL OR 17X26 10 PK STRL BLUE (TOWEL DISPOSABLE) ×3 IMPLANT
TOWEL OR NON WOVEN STRL DISP B (DISPOSABLE) ×3 IMPLANT
TRAY LAP CHOLE (CUSTOM PROCEDURE TRAY) ×3 IMPLANT
TROCAR BLADELESS OPT 5 75 (ENDOMECHANICALS) ×3 IMPLANT
TROCAR SLEEVE XCEL 5X75 (ENDOMECHANICALS) ×6 IMPLANT
TROCAR XCEL BLUNT TIP 100MML (ENDOMECHANICALS) ×3 IMPLANT
TUBING INSUFFLATION 10FT LAP (TUBING) ×3 IMPLANT

## 2014-01-25 NOTE — Transfer of Care (Signed)
Immediate Anesthesia Transfer of Care Note  Patient: Barbara Wu  Procedure(s) Performed: Procedure(s) (LRB): LAPAROSCOPIC CHOLECYSTECTOMY (N/A)  Patient Location: PACU  Anesthesia Type: General  Level of Consciousness: sedated, patient cooperative and responds to stimulation  Airway & Oxygen Therapy: Patient Spontanous Breathing and Patient connected to face mask oxgen  Post-op Assessment: Report given to PACU RN and Post -op Vital signs reviewed and stable  Post vital signs: Reviewed and stable  Complications: No apparent anesthesia complications

## 2014-01-25 NOTE — Interval H&P Note (Signed)
History and Physical Interval Note: no change in H and P  01/25/2014 1:13 PM  Barbara Wu  has presented today for surgery, with the diagnosis of Biliary Dyskinesia  The various methods of treatment have been discussed with the patient and family. After consideration of risks, benefits and other options for treatment, the patient has consented to  Procedure(s): LAPAROSCOPIC CHOLECYSTECTOMY (N/A) as a surgical intervention .  The patient's history has been reviewed, patient examined, no change in status, stable for surgery.  I have reviewed the patient's chart and labs.  Questions were answered to the patient's satisfaction.     [name]

## 2014-01-25 NOTE — Anesthesia Preprocedure Evaluation (Addendum)
Anesthesia Evaluation  Patient identified by MRN, date of birth, ID band Patient awake    Reviewed: Allergy & Precautions, H&P , NPO status , Patient's Chart, lab work & pertinent test results  Airway Mallampati: III TM Distance: <3 FB Neck ROM: Full    Dental no notable dental hx.    Pulmonary Current Smoker,  breath sounds clear to auscultation  Pulmonary exam normal       Cardiovascular negative cardio ROS  Rhythm:Regular Rate:Normal     Neuro/Psych negative neurological ROS  negative psych ROS   GI/Hepatic Neg liver ROS,   Endo/Other  diabetesMorbid obesity  Renal/GU negative Renal ROS  negative genitourinary   Musculoskeletal negative musculoskeletal ROS (+)   Abdominal   Peds negative pediatric ROS (+)  Hematology negative hematology ROS (+)   Anesthesia Other Findings   Reproductive/Obstetrics negative OB ROS                          Anesthesia Physical Anesthesia Plan  ASA: II  Anesthesia Plan: General   Post-op Pain Management:    Induction: Intravenous  Airway Management Planned: Oral ETT  Additional Equipment:   Intra-op Plan:   Post-operative Plan: Extubation in OR  Informed Consent: I have reviewed the patients History and Physical, chart, labs and discussed the procedure including the risks, benefits and alternatives for the proposed anesthesia with the patient or authorized representative who has indicated his/her understanding and acceptance.   Dental advisory given  Plan Discussed with: CRNA and Surgeon  Anesthesia Plan Comments:         Anesthesia Quick Evaluation

## 2014-01-25 NOTE — Op Note (Signed)
Laparoscopic Cholecystectomy Procedure Note  Indications: This patient presents with symptomatic gallbladder disease and will undergo laparoscopic cholecystectomy.  Pre-operative Diagnosis: biliary dyskinesia  Post-operative Diagnosis: Same  Surgeon: Abigail Miyamoto A   Assistants: 0  Anesthesia: General endotracheal anesthesia  ASA Class: 2  Procedure Details  The patient was seen again in the Holding Room. The risks, benefits, complications, treatment options, and expected outcomes were discussed with the patient. The possibilities of reaction to medication, pulmonary aspiration, perforation of viscus, bleeding, recurrent infection, finding a normal gallbladder, the need for additional procedures, failure to diagnose a condition, the possible need to convert to an open procedure, and creating a complication requiring transfusion or operation were discussed with the patient. The likelihood of improving the patient's symptoms with return to their baseline status is good.  The patient and/or family concurred with the proposed plan, giving informed consent. The site of surgery properly noted. The patient was taken to Operating Room, identified as Barbara Wu and the procedure verified as Laparoscopic Cholecystectomy with Intraoperative Cholangiogram. A Time Out was held and the above information confirmed.  Prior to the induction of general anesthesia, antibiotic prophylaxis was administered. General endotracheal anesthesia was then administered and tolerated well. After the induction, the abdomen was prepped with Chloraprep and draped in sterile fashion. The patient was positioned in the supine position.  Local anesthetic agent was injected into the skin near the umbilicus and an incision made. We dissected down to the abdominal fascia with blunt dissection.  The fascia was incised vertically and we entered the peritoneal cavity bluntly.  A pursestring suture of 0-Vicryl was placed around the  fascial opening.  The Hasson cannula was inserted and secured with the stay suture.  Pneumoperitoneum was then created with CO2 and tolerated well without any adverse changes in the patient's vital signs. An 11-mm port was placed in the subxiphoid position.  Two 5-mm ports were placed in the right upper quadrant. All skin incisions were infiltrated with a local anesthetic agent before making the incision and placing the trocars.   We positioned the patient in reverse Trendelenburg, tilted slightly to the patient's left.  The gallbladder was identified, the fundus grasped and retracted cephalad. Adhesions were lysed bluntly and with the electrocautery where indicated, taking care not to injure any adjacent organs or viscus. The infundibulum was grasped and retracted laterally, exposing the peritoneum overlying the triangle of Calot. This was then divided and exposed in a blunt fashion. The cystic duct was clearly identified and bluntly dissected circumferentially. A critical view of the cystic duct and cystic artery was obtained.  The cystic duct was then ligated with clips and divided. The cystic artery was, dissected free, ligated with clips and divided as well.   The gallbladder was dissected from the liver bed in retrograde fashion with the electrocautery. The gallbladder was removed and placed in an Endocatch sac. The liver bed was irrigated and inspected. Hemostasis was achieved with the electrocautery. Copious irrigation was utilized and was repeatedly aspirated until clear.  The gallbladder and Endocatch sac were then removed through the umbilical port site.  The pursestring suture was used to close the umbilical fascia.    We again inspected the right upper quadrant for hemostasis.  Pneumoperitoneum was released as we removed the trocars.  4-0 Monocryl was used to close the skin.   Benzoin, steri-strips, and clean dressings were applied. The patient was then extubated and brought to the recovery room  in stable condition. Instrument, sponge, and needle  counts were correct at closure and at the conclusion of the case.   Findings: Cholecystitis without Cholelithiasis  Estimated Blood Loss: Minimal         Drains: 0         Specimens: Gallbladder           Complications: None; patient tolerated the procedure well.         Disposition: PACU - hemodynamically stable.         Condition: stable

## 2014-01-25 NOTE — Discharge Instructions (Signed)
[facility] ______[facility] °LAPAROSCOPIC SURGERY: POST OP INSTRUCTIONS °Always review your discharge instruction sheet given to you by the facility where your surgery was performed. °IF YOU HAVE DISABILITY OR FAMILY LEAVE FORMS, YOU MUST BRING THEM TO THE OFFICE FOR PROCESSING.   °DO NOT GIVE THEM TO YOUR DOCTOR. ° °1. A prescription for pain medication may be given to you upon discharge.  Take your pain medication as prescribed, if needed.  If narcotic pain medicine is not needed, then you may take acetaminophen (Tylenol) or ibuprofen (Advil) as needed. °2. Take your usually prescribed medications unless otherwise directed. °3. If you need a refill on your pain medication, please contact your pharmacy.  They will contact our office to request authorization. Prescriptions will not be filled after 5pm or on week-ends. °4. You should follow a light diet the first few days after arrival home, such as soup and crackers, etc.  Be sure to include lots of fluids daily. °5. Most patients will experience some swelling and bruising in the area of the incisions.  Ice packs will help.  Swelling and bruising can take several days to resolve.  °6. It is common to experience some constipation if taking pain medication after surgery.  Increasing fluid intake and taking a stool softener (such as Colace) will usually help or prevent this problem from occurring.  A mild laxative (Milk of Magnesia or Miralax) should be taken according to package instructions if there are no bowel movements after 48 hours. °7. Unless discharge instructions indicate otherwise, you may remove your bandages 24-48 hours after surgery, and you may shower at that time.  You may have steri-strips (small skin tapes) in place directly over the incision.  These strips should be left on the skin for 7-10 days.  If your surgeon used skin glue on the incision, you may shower in 24 hours.  The glue will flake off over the next 2-3 weeks.  Any sutures or  staples will be removed at the office during your follow-up visit. °8. ACTIVITIES:  You may resume regular (light) daily activities beginning the next day--such as daily self-care, walking, climbing stairs--gradually increasing activities as tolerated.  You may have sexual intercourse when it is comfortable.  Refrain from any heavy lifting or straining until approved by your doctor. °a. You may drive when you are no longer taking prescription pain medication, you can comfortably wear a seatbelt, and you can safely maneuver your car and apply brakes. °b. RETURN TO WORK:  __________________________________________________________ °9. You should see your doctor in the office for a follow-up appointment approximately 2-3 weeks after your surgery.  Make sure that you call for this appointment within a day or two after you arrive home to insure a convenient appointment time. °10. OTHER INSTRUCTIONS: __________________________________________________________________________________________________________________________ __________________________________________________________________________________________________________________________ °WHEN TO CALL YOUR DOCTOR: °1. Fever over 101.0 °2. Inability to urinate °3. Continued bleeding from incision. °4. Increased pain, redness, or drainage from the incision. °5. Increasing abdominal pain ° °The clinic staff is available to answer your questions during regular business hours.  Please don’t hesitate to call and ask to speak to one of the nurses for clinical concerns.  If you have a medical emergency, go to the nearest emergency room or call 911.  A surgeon from [facility] is always on call at the hospital. °[address], [address], [place], [place]  [address] ? [address] [address], [place], [place]   [address] °[phone] ? [phone] ? FAX [phone] °Web site:   [email or web address] °

## 2014-01-25 NOTE — Anesthesia Postprocedure Evaluation (Signed)
  Anesthesia Post-op Note  Patient: Barbara Wu  Procedure(s) Performed: Procedure(s) (LRB): LAPAROSCOPIC CHOLECYSTECTOMY (N/A)  Patient Location: PACU  Anesthesia Type: General  Level of Consciousness: awake and alert   Airway and Oxygen Therapy: Patient Spontanous Breathing  Post-op Pain: mild  Post-op Assessment: Post-op Vital signs reviewed, Patient's Cardiovascular Status Stable, Respiratory Function Stable, Patent Airway and No signs of Nausea or vomiting  Last Vitals:  Filed Vitals:   01/25/14 1555  BP:   Pulse: 56  Temp: 36.9 C  Resp: 20    Post-op Vital Signs: stable   Complications: No apparent anesthesia complications

## 2014-01-26 ENCOUNTER — Encounter (HOSPITAL_COMMUNITY): Payer: Self-pay | Admitting: Surgery

## 2014-06-03 ENCOUNTER — Other Ambulatory Visit (HOSPITAL_COMMUNITY): Payer: Self-pay | Admitting: Orthopedic Surgery

## 2014-06-03 DIAGNOSIS — M79672 Pain in left foot: Secondary | ICD-10-CM

## 2014-06-16 ENCOUNTER — Encounter (HOSPITAL_COMMUNITY)
Admission: RE | Admit: 2014-06-16 | Discharge: 2014-06-16 | Disposition: A | Discharge status: Home / Self Care | Payer: Medicare Other | Source: Ambulatory Visit | Attending: Orthopedic Surgery | Admitting: Orthopedic Surgery

## 2014-06-16 ENCOUNTER — Encounter (HOSPITAL_COMMUNITY): Payer: Self-pay

## 2014-06-16 DIAGNOSIS — M79672 Pain in left foot: Secondary | ICD-10-CM

## 2014-06-16 MED ORDER — TECHNETIUM TC 99M MEDRONATE IV KIT
24.0000 | PACK | Freq: Once | INTRAVENOUS | Status: AC | PRN
  Administered 2014-06-16: 24 via INTRAVENOUS

## 2014-07-31 ENCOUNTER — Emergency Department (HOSPITAL_COMMUNITY): Payer: Medicare Other

## 2014-07-31 ENCOUNTER — Emergency Department (HOSPITAL_COMMUNITY)
Admission: EM | Admit: 2014-07-31 | Discharge: 2014-07-31 | Disposition: A | Discharge status: Home / Self Care | Payer: Medicare Other | Attending: Emergency Medicine | Admitting: Emergency Medicine

## 2014-07-31 ENCOUNTER — Encounter (HOSPITAL_COMMUNITY): Payer: Self-pay | Admitting: Emergency Medicine

## 2014-07-31 ENCOUNTER — Emergency Department (INDEPENDENT_AMBULATORY_CARE_PROVIDER_SITE_OTHER)
Admission: EM | Admit: 2014-07-31 | Discharge: 2014-07-31 | Disposition: A | Discharge status: Home / Self Care | Payer: Medicare Other | Source: Home / Self Care | Attending: Emergency Medicine | Admitting: Emergency Medicine

## 2014-07-31 ENCOUNTER — Encounter (HOSPITAL_COMMUNITY): Payer: Self-pay

## 2014-07-31 DIAGNOSIS — R0789 Other chest pain: Secondary | ICD-10-CM | POA: Diagnosis not present

## 2014-07-31 DIAGNOSIS — F419 Anxiety disorder, unspecified: Secondary | ICD-10-CM | POA: Insufficient documentation

## 2014-07-31 DIAGNOSIS — Z720 Tobacco use: Secondary | ICD-10-CM | POA: Diagnosis not present

## 2014-07-31 DIAGNOSIS — R079 Chest pain, unspecified: Secondary | ICD-10-CM | POA: Insufficient documentation

## 2014-07-31 DIAGNOSIS — Z8669 Personal history of other diseases of the nervous system and sense organs: Secondary | ICD-10-CM | POA: Insufficient documentation

## 2014-07-31 DIAGNOSIS — Z8719 Personal history of other diseases of the digestive system: Secondary | ICD-10-CM | POA: Insufficient documentation

## 2014-07-31 DIAGNOSIS — Z87442 Personal history of urinary calculi: Secondary | ICD-10-CM | POA: Diagnosis not present

## 2014-07-31 DIAGNOSIS — R0602 Shortness of breath: Secondary | ICD-10-CM | POA: Diagnosis not present

## 2014-07-31 DIAGNOSIS — M1990 Unspecified osteoarthritis, unspecified site: Secondary | ICD-10-CM | POA: Insufficient documentation

## 2014-07-31 DIAGNOSIS — E119 Type 2 diabetes mellitus without complications: Secondary | ICD-10-CM | POA: Diagnosis not present

## 2014-07-31 DIAGNOSIS — Z8742 Personal history of other diseases of the female genital tract: Secondary | ICD-10-CM | POA: Insufficient documentation

## 2014-07-31 DIAGNOSIS — M79602 Pain in left arm: Secondary | ICD-10-CM | POA: Diagnosis not present

## 2014-07-31 LAB — BASIC METABOLIC PANEL
Anion gap: 8 (ref 5–15)
BUN: 17 mg/dL (ref 6–23)
CALCIUM: 9.5 mg/dL (ref 8.4–10.5)
CHLORIDE: 109 mmol/L (ref 96–112)
CO2: 24 mmol/L (ref 19–32)
Creatinine, Ser: 0.76 mg/dL (ref 0.50–1.10)
GFR calc Af Amer: >90 mL/min (ref >90)
Glucose, Bld: 96 mg/dL (ref 70–99)
Potassium: 3.7 mmol/L (ref 3.5–5.1)
Sodium: 141 mmol/L (ref 135–145)

## 2014-07-31 LAB — CBC
HEMATOCRIT: 40.9 % (ref 36.0–46.0)
Hemoglobin: 14 g/dL (ref 12.0–15.0)
MCH: 31.3 pg (ref 26.0–34.0)
MCHC: 34.2 g/dL (ref 30.0–36.0)
MCV: 91.5 fL (ref 78.0–100.0)
Platelets: 220 10*3/uL (ref 150–400)
RBC: 4.47 MIL/uL (ref 3.87–5.11)
RDW: 13.2 % (ref 11.5–15.5)
WBC: 6.5 10*3/uL (ref 4.0–10.5)

## 2014-07-31 LAB — I-STAT TROPONIN, ED: TROPONIN I, POC: 0 ng/mL (ref 0.00–0.08)

## 2014-07-31 MED ORDER — NITROGLYCERIN 0.4 MG SL SUBL
SUBLINGUAL_TABLET | SUBLINGUAL | Status: AC
  Filled 2014-07-31: qty 1

## 2014-07-31 MED ORDER — ASPIRIN 81 MG PO CHEW
324.0000 mg | CHEWABLE_TABLET | Freq: Once | ORAL | Status: AC
  Administered 2014-07-31: 324 mg via ORAL

## 2014-07-31 MED ORDER — NITROGLYCERIN 0.4 MG SL SUBL
0.4000 mg | SUBLINGUAL_TABLET | SUBLINGUAL | Status: DC | PRN
  Administered 2014-07-31: 0.4 mg via SUBLINGUAL

## 2014-07-31 MED ORDER — NAPROXEN 500 MG PO TABS: 500.0000 mg | ORAL_TABLET | Freq: Two times a day (BID) | ORAL | Status: DC

## 2014-07-31 MED ORDER — ASPIRIN 81 MG PO CHEW
CHEWABLE_TABLET | ORAL | Status: AC
  Filled 2014-07-31: qty 4

## 2014-07-31 MED ORDER — SODIUM CHLORIDE 0.9 % IV SOLN
INTRAVENOUS | Status: DC
  Administered 2014-07-31: 16:00:00 via INTRAVENOUS

## 2014-07-31 NOTE — ED Provider Notes (Signed)
CSN: [id number]     Arrival date & time 07/31/14  1648 History   First MD Initiated Contact with Patient 07/31/14 1709     Chief Complaint  Patient presents with  . Chest Pain  . Arm Pain     (Consider location/radiation/quality/duration/timing/severity/associated sxs/prior Treatment) HPI Barbara Wu is a 44 y.o. female who comes in for evaluation of chest discomfort and shortness of breath and discomfort in her left arm. Patient states for the past 3 weeks she has had constant chest discomfort that she characterizes as "dull pressure". She rates her discomfort as a 4/10. She also reports associated shortness of breath as she was walking to urgent care today. She also reports that 4:00 she was sitting still when she felt like her left arm became heavy and felt cold. She reports moving her hand and fingers and this sensation mostly resolved. She attributes her symptoms to "being maxed out", she reports her husband and father have both had open heart surgery within the past 2 months and she is anxious. She denies any cough, hemoptysis, recent travels or surgeries, personal history of cancer, history of blood clot, leg swelling, unilateral leg swelling or redness, no exogenous estrogen. No other aggravating or modifying factors.  Past Medical History  Diagnosis Date  . Interstitial cystitis   . RSD (reflex sympathetic dystrophy)   . PONV (postoperative nausea and vomiting)   . PONV (postoperative nausea and vomiting)   . Arthritis   . GERD (gastroesophageal reflux disease)   . Fibromyalgia   . History of kidney stones   . Diabetes mellitus without complication     diet controlled  . Biliary dyskinesia    Past Surgical History  Procedure Laterality Date  . Abdominal hysterectomy    . Cesarean section    . Tonsillectomy    . Anterior cruciate ligament repair    . Carpal tunnel release Bilateral   . Interstim implant placement  Feb 2010    bladder  . Hip arthroscopy Left 10/28/2012   Procedure: ARTHROSCOPY HIP- left;  Surgeon: Loreta Ave, MD;  Location: [facility] OR;  Service: Orthopedics;  Laterality: Left;  . Cholecystectomy N/A 01/25/2014    Procedure: LAPAROSCOPIC CHOLECYSTECTOMY;  Surgeon: Abigail Miyamoto, MD;  Location: [facility] ORS;  Service: General;  Laterality: N/A;   Family History  Problem Relation Age of Onset  . Seizures    . Stroke     History  Substance Use Topics  . Smoking status: Current Every Day Smoker -- 0.50 packs/day for 27 years  . Smokeless tobacco: Not on file  . Alcohol Use: No   OB History    No data available     Review of Systems A 10 point review of systems was completed and was negative except for pertinent positives and negatives as mentioned in the history of present illness     Allergies  Demerol; Morphine and related; Other; and Tape  Home Medications   Prior to Admission medications   Medication Sig Start Date End Date Taking? Authorizing Provider  ibuprofen (ADVIL,MOTRIN) 200 MG tablet Take 200 mg by mouth every 6 (six) hours as needed for headache.   Yes Historical Provider, MD  HYDROcodone-acetaminophen (NORCO/VICODIN) 5-325 MG per tablet Take 1-2 tablets by mouth every 4 (four) hours as needed for moderate pain. 01/25/14   Abigail Miyamoto, MD  naproxen (NAPROSYN) 500 MG tablet Take 1 tablet (500 mg total) by mouth 2 (two) times daily. 07/31/14   Sharlene Motts, PA-C  BP 136/63 mmHg  Pulse 52  Temp(Src) 98.3 F (36.8 C) (Oral)  Resp 20  Ht  (1.549 m)  Wt 198 lb (89.812 kg)  BMI 37.43 kg/m2  SpO2 100% Physical Exam  Constitutional: She is oriented to person, place, and time. She appears well-developed and well-nourished.  HENT:  Head: Normocephalic and atraumatic.  Mouth/Throat: Oropharynx is clear and moist.  Eyes: Conjunctivae are normal. Pupils are equal, round, and reactive to light. Right eye exhibits no discharge. Left eye exhibits no discharge. No scleral icterus.  Neck: Neck supple.  Cardiovascular:  Normal rate, regular rhythm and normal heart sounds.   Pulmonary/Chest: Effort normal and breath sounds normal. No respiratory distress. She has no wheezes. She has no rales.  During palpation of patient's sternum and intercostal spaces reproduces discomfort.  Abdominal: Soft. There is no tenderness.  Musculoskeletal: She exhibits no tenderness.  Neurological: She is alert and oriented to person, place, and time.  Cranial Nerves II-XII grossly intact. Moves all 4 extremities without ataxia  Skin: Skin is warm and dry. No rash noted.  Psychiatric: She has a normal mood and affect.  Nursing note and vitals reviewed.   ED Course  Procedures (including critical care time) Labs Review Labs Reviewed  CBC  BASIC METABOLIC PANEL  I-STAT TROPOININ, ED    Imaging Review Dg Chest 2 View  07/31/2014   CLINICAL DATA:  Chest pain and arm pain  EXAM: CHEST  2 VIEW  COMPARISON:  10/22/2012  FINDINGS: Mild cardiac enlargement without heart failure. Lungs are clear without infiltrate or effusion.  IMPRESSION: No active cardiopulmonary disease.   Electronically Signed   By: Marlan Palau M.D.   On: 07/31/2014 17:52     EKG Interpretation   Date/Time:  Sunday July 31 2014 16:59:17 EST Ventricular Rate:  61 PR Interval:  153 QRS Duration: 93 QT Interval:  447 QTC Calculation: 450 R Axis:   92 Text Interpretation:  Sinus rhythm Low voltage, precordial leads Consider  right ventricular hypertrophy since last tracing no significant change  Confirmed by Effie Shy  MD, Mechele Collin [phone]) on 07/31/2014 6:04:12 PM     Meds given in ED:  Medications - No data to display  Discharge Medication List as of 07/31/2014  8:14 PM    START taking these medications   Details  naproxen (NAPROSYN) 500 MG tablet Take 1 tablet (500 mg total) by mouth 2 (two) times daily., Starting 07/31/2014, Until Discontinued, Print       Filed Vitals:   07/31/14 1706 07/31/14 2022  BP: 123/65 136/63  Pulse: 59 52  Temp: 98.2 F  (36.8 C) 98.3 F (36.8 C)  TempSrc: Oral Oral  Resp: 16 20  Height:  (1.549 m)   Weight: 198 lb (89.812 kg)   SpO2: 100% 100%    MDM  Vitals stable - WNL -afebrile Pt resting comfortably in ED. Denies any discomfort PE--unremarkable Labwork noncontributory, troponin negative, EKG reassuring. Imaging--chest x-ray shows no acute cardio pulmonary pathology.  DDX--PERC negative, clinical picture not consistent with ACS given 3 week chronicity with negative troponin and EKG. Heart score 2. Low concern for dissection, esophageal rupture or other mediastinitis. Patient symptoms likely due to lifestyle stressors and not related to acute or emergent pathology.  I discussed all relevant lab findings and imaging results with pt and they verbalized understanding. Discussed f/u with PCP within 48 hrs and return precautions, pt very amenable to plan.  Final diagnoses:  Chest discomfort  Earle GellBenjamin W St. Charlesartner, PA-C 08/01/14 0122  Mancel BaleElliott Wentz, MD 08/01/14 1149

## 2014-07-31 NOTE — ED Notes (Signed)
[facility] sob [facility]5 [facility]. Pt states increased stressors with family and reports left arm "heaviness" that started x1 hour ago. NAD noted. No neuro deficits noted at this time. Pt given 324 mg of aspirin by [facility] and 2 nitro. Pt states pressure is decreased to 2/10.

## 2014-07-31 NOTE — ED Notes (Signed)
Patient complains of tightness in chest #4 on pain scale Having some SOB that started yesterday afternoon Left arm feels heay Family history of heart attack

## 2014-07-31 NOTE — ED Notes (Signed)
Pt returned from xray

## 2014-07-31 NOTE — Discharge Instructions (Signed)

## 2014-07-31 NOTE — ED Provider Notes (Signed)
CSN: [id number]     Arrival date & time 07/31/14  1531 History   First MD Initiated Contact with Patient 07/31/14 1540     Chief Complaint  Patient presents with  . Chest Pain   (Consider location/radiation/quality/duration/timing/severity/associated sxs/prior Treatment) HPI      44 year old female presents complaining of a tightness and heaviness in her chest, shortness of breath, numbness in her left arm, shortness of breath, dizziness. The chest pain has been present for about 2 days, the arm numbness and dizziness just started 30 minutes prior to arrival. She was at the hospital visiting a family member and went down to the emergency room but then decided to come here instead. No nausea or vomiting. No history of coronary artery disease.  Past Medical History  Diagnosis Date  . Interstitial cystitis   . RSD (reflex sympathetic dystrophy)   . PONV (postoperative nausea and vomiting)   . PONV (postoperative nausea and vomiting)   . Arthritis   . GERD (gastroesophageal reflux disease)   . Fibromyalgia   . History of kidney stones   . Diabetes mellitus without complication     diet controlled  . Biliary dyskinesia    Past Surgical History  Procedure Laterality Date  . Abdominal hysterectomy    . Cesarean section    . Tonsillectomy    . Anterior cruciate ligament repair    . Carpal tunnel release Bilateral   . Interstim implant placement  Feb 2010    bladder  . Hip arthroscopy Left 10/28/2012    Procedure: ARTHROSCOPY HIP- left;  Surgeon: Loreta Ave, MD;  Location: [facility] OR;  Service: Orthopedics;  Laterality: Left;  . Cholecystectomy N/A 01/25/2014    Procedure: LAPAROSCOPIC CHOLECYSTECTOMY;  Surgeon: Abigail Miyamoto, MD;  Location: [facility] ORS;  Service: General;  Laterality: N/A;   Family History  Problem Relation Age of Onset  . Seizures    . Stroke     History  Substance Use Topics  . Smoking status: Current Every Day Smoker -- 0.50 packs/day for 27 years  . Smokeless  tobacco: Not on file  . Alcohol Use: No   OB History    No data available     Review of Systems  Constitutional: Negative for fever and chills.  Respiratory: Positive for shortness of breath.   Cardiovascular: Positive for chest pain.  Gastrointestinal: Negative for nausea and vomiting.  Neurological: Positive for weakness, light-headedness and numbness.  All other systems reviewed and are negative.   Allergies  Demerol; Morphine and related; Other; and Tape  Home Medications   Prior to Admission medications   Medication Sig Start Date End Date Taking? Authorizing Provider  HYDROcodone-acetaminophen (NORCO/VICODIN) 5-325 MG per tablet Take 1-2 tablets by mouth every 4 (four) hours as needed for moderate pain. 01/25/14   Abigail Miyamoto, MD   BP 118/78 mmHg  Pulse 68  Temp(Src) 98 F (36.7 C) (Oral)  Resp 20  SpO2 100% Physical Exam  Constitutional: She is oriented to person, place, and time. Vital signs are normal. She appears well-developed and well-nourished. No distress.  HENT:  Head: Normocephalic and atraumatic.  Cardiovascular: Normal rate, regular rhythm and normal heart sounds.   Pulmonary/Chest: Effort normal and breath sounds normal. No respiratory distress.  Neurological: She is alert and oriented to person, place, and time. She has normal strength. Coordination normal.  Skin: Skin is warm and dry. No rash noted. She is not diaphoretic.  Psychiatric: She has a normal mood and affect. Judgment normal.  Nursing note and vitals reviewed.   ED Course  ED EKG  Date/Time: 07/31/2014 3:53 PM Performed by: Autumn MessingBAKER, Braden Deloach, H Authorized by: Charm RingsHONIG, ERIN J Comparison: not compared with previous ECG  Rhythm: sinus rhythm Rate: normal QRS axis: normal Conduction: conduction normal ST Segments: ST segments normal T Waves: T waves normal Other: no other findings Clinical impression: normal ECG   (including critical care time) Labs Review Labs Reviewed - No data to  display  Imaging Review No results found.   MDM   1. Other chest pain    Patient with chest pain, shortness of breath, left arm numbness. Concerning for acute coronary syndrome.   IV started, cardiac monitoring, 2 L of oxygen via nasal cannula, given 324 mg of aspirin and 0.4 mg sublingual nitroglycerin with partial improvement, she was given another sublingual nitroglycerin with more improvement in her chest pain prior to EMS arrival  Transferred to ED via EMS    Graylon GoodZachary H Deidre Carino, PA-C 07/31/14 1640

## 2014-12-15 ENCOUNTER — Encounter (HOSPITAL_COMMUNITY): Payer: Self-pay | Admitting: *Deleted

## 2014-12-15 ENCOUNTER — Emergency Department (HOSPITAL_COMMUNITY): Payer: Medicare Other

## 2014-12-15 ENCOUNTER — Emergency Department (HOSPITAL_COMMUNITY)
Admission: EM | Admit: 2014-12-15 | Discharge: 2014-12-15 | Disposition: A | Discharge status: Home / Self Care | Payer: Medicare Other | Attending: Emergency Medicine | Admitting: Emergency Medicine

## 2014-12-15 DIAGNOSIS — Z8669 Personal history of other diseases of the nervous system and sense organs: Secondary | ICD-10-CM | POA: Diagnosis not present

## 2014-12-15 DIAGNOSIS — E119 Type 2 diabetes mellitus without complications: Secondary | ICD-10-CM | POA: Diagnosis not present

## 2014-12-15 DIAGNOSIS — R109 Unspecified abdominal pain: Secondary | ICD-10-CM | POA: Insufficient documentation

## 2014-12-15 DIAGNOSIS — Z87442 Personal history of urinary calculi: Secondary | ICD-10-CM | POA: Diagnosis not present

## 2014-12-15 DIAGNOSIS — Z3202 Encounter for pregnancy test, result negative: Secondary | ICD-10-CM | POA: Diagnosis not present

## 2014-12-15 DIAGNOSIS — Z720 Tobacco use: Secondary | ICD-10-CM | POA: Diagnosis not present

## 2014-12-15 DIAGNOSIS — M1990 Unspecified osteoarthritis, unspecified site: Secondary | ICD-10-CM | POA: Insufficient documentation

## 2014-12-15 DIAGNOSIS — Z8719 Personal history of other diseases of the digestive system: Secondary | ICD-10-CM | POA: Insufficient documentation

## 2014-12-15 DIAGNOSIS — Z87448 Personal history of other diseases of urinary system: Secondary | ICD-10-CM | POA: Diagnosis not present

## 2014-12-15 LAB — URINALYSIS, ROUTINE W REFLEX MICROSCOPIC
Bilirubin Urine: NEGATIVE
GLUCOSE, UA: NEGATIVE mg/dL
Hgb urine dipstick: NEGATIVE
Ketones, ur: NEGATIVE mg/dL
Leukocytes, UA: NEGATIVE
Nitrite: NEGATIVE
Protein, ur: NEGATIVE mg/dL
Specific Gravity, Urine: 1.012 (ref 1.005–1.030)
Urobilinogen, UA: 0.2 mg/dL (ref 0.0–1.0)
pH: 6 (ref 5.0–8.0)

## 2014-12-15 LAB — CBC
HCT: 41.1 % (ref 36.0–46.0)
Hemoglobin: 14.3 g/dL (ref 12.0–15.0)
MCH: 31.8 pg (ref 26.0–34.0)
MCHC: 34.8 g/dL (ref 30.0–36.0)
MCV: 91.5 fL (ref 78.0–100.0)
PLATELETS: 242 10*3/uL (ref 150–400)
RBC: 4.49 MIL/uL (ref 3.87–5.11)
RDW: 12.6 % (ref 11.5–15.5)
WBC: 7 10*3/uL (ref 4.0–10.5)

## 2014-12-15 LAB — COMPREHENSIVE METABOLIC PANEL
ALT: 27 U/L (ref 14–54)
AST: 21 U/L (ref 15–41)
Albumin: 4.8 g/dL (ref 3.5–5.0)
Alkaline Phosphatase: 41 U/L (ref 38–126)
Anion gap: 8 (ref 5–15)
BUN: 15 mg/dL (ref 6–20)
CALCIUM: 9.8 mg/dL (ref 8.9–10.3)
CHLORIDE: 107 mmol/L (ref 101–111)
CO2: 26 mmol/L (ref 22–32)
CREATININE: 0.71 mg/dL (ref 0.44–1.00)
GFR calc Af Amer: >60 mL/min (ref >60)
GFR calc non Af Amer: >60 mL/min (ref >60)
Glucose, Bld: 69 mg/dL (ref 65–99)
POTASSIUM: 3.8 mmol/L (ref 3.5–5.1)
Sodium: 141 mmol/L (ref 135–145)
TOTAL PROTEIN: 7.8 g/dL (ref 6.5–8.1)
Total Bilirubin: 0.6 mg/dL (ref 0.3–1.2)

## 2014-12-15 LAB — I-STAT BETA HCG BLOOD, ED (MC, WL, AP ONLY): I-stat hCG, quantitative: <5 m[IU]/mL (ref <5)

## 2014-12-15 MED ORDER — SODIUM CHLORIDE 0.9 % IV BOLUS (SEPSIS)
1000.0000 mL | Freq: Once | INTRAVENOUS | Status: AC
  Administered 2014-12-15: 1000 mL via INTRAVENOUS

## 2014-12-15 MED ORDER — KETOROLAC TROMETHAMINE 15 MG/ML IJ SOLN
15.0000 mg | Freq: Once | INTRAMUSCULAR | Status: AC
  Administered 2014-12-15: 15 mg via INTRAVENOUS
  Filled 2014-12-15: qty 1

## 2014-12-15 MED ORDER — HYDROMORPHONE HCL 1 MG/ML IJ SOLN
1.0000 mg | Freq: Once | INTRAMUSCULAR | Status: AC
  Administered 2014-12-15: 1 mg via INTRAVENOUS
  Filled 2014-12-15: qty 1

## 2014-12-15 MED ORDER — ONDANSETRON HCL 4 MG/2ML IJ SOLN
4.0000 mg | Freq: Once | INTRAMUSCULAR | Status: AC
  Administered 2014-12-15: 4 mg via INTRAVENOUS
  Filled 2014-12-15: qty 2

## 2014-12-15 MED ORDER — HYDROMORPHONE HCL 1 MG/ML IJ SOLN
1.0000 mg | INTRAMUSCULAR | Status: DC | PRN
  Filled 2014-12-15: qty 1

## 2014-12-15 NOTE — ED Provider Notes (Signed)
CSN: [id number]     Arrival date & time 12/15/14  1602 History   First MD Initiated Contact with Patient 12/15/14 1612     Chief Complaint  Patient presents with  . Flank Pain     Patient is a 44 y.o. female presenting with flank pain. The history is provided by the patient. No language interpreter was used.  Flank Pain   Ms. Lebeda presents for evaluation of right flank pain.  Pain started yesterday afternoon and is described as sharp and stabbing in nature.  There are no changes with positioning.  She has abdominal fullness but no abdominal pain.  She has nausea and chills, no fever.  No diarrhea, constipation.  She has chronic dysuria - unchanged from baseline.  She has a hx/o kidney stones s/p lithotripsy and stenting in 2010.  She is s/p cholecystectomy.  Sxs are severe, constant, worsening.    Past Medical History  Diagnosis Date  . Interstitial cystitis   . RSD (reflex sympathetic dystrophy)   . PONV (postoperative nausea and vomiting)   . PONV (postoperative nausea and vomiting)   . Arthritis   . GERD (gastroesophageal reflux disease)   . Fibromyalgia   . History of kidney stones   . Diabetes mellitus without complication     diet controlled  . Biliary dyskinesia    Past Surgical History  Procedure Laterality Date  . Abdominal hysterectomy    . Cesarean section    . Tonsillectomy    . Anterior cruciate ligament repair    . Carpal tunnel release Bilateral   . Interstim implant placement  Feb 2010    bladder  . Hip arthroscopy Left 10/28/2012    Procedure: ARTHROSCOPY HIP- left;  Surgeon: Loreta Ave, MD;  Location: [facility] OR;  Service: Orthopedics;  Laterality: Left;  . Cholecystectomy N/A 01/25/2014    Procedure: LAPAROSCOPIC CHOLECYSTECTOMY;  Surgeon: Abigail Miyamoto, MD;  Location: [facility];  Service: General;  Laterality: N/A;   Family History  Problem Relation Age of Onset  . Seizures    . Stroke     History  Substance Use Topics  . Smoking status: Current  Every Day Smoker -- 0.50 packs/day for 27 years  . Smokeless tobacco: Not on file  . Alcohol Use: No   OB History    No data available     Review of Systems  Genitourinary: Positive for flank pain.  All other systems reviewed and are negative.     Allergies  Demerol; Morphine and related; Other; and Tape  Home Medications   Prior to Admission medications   Medication Sig Start Date End Date Taking? Authorizing Provider  amitriptyline (ELAVIL) 25 MG tablet Take 25 mg by mouth at bedtime.   Yes Historical Provider, MD  hydrOXYzine (ATARAX/VISTARIL) 25 MG tablet Take 25 mg by mouth at bedtime.   Yes Historical Provider, MD  naproxen (NAPROSYN) 500 MG tablet Take 1 tablet (500 mg total) by mouth 2 (two) times daily. Patient not taking: Reported on 12/15/2014 07/31/14   Joycie Peek, PA-C   BP 129/82 mmHg  Pulse 79  Temp(Src) 98.7 F (37.1 C) (Oral)  Resp 17  SpO2 100% Physical Exam  Constitutional: She is oriented to person, place, and time. She appears well-developed and well-nourished.  HENT:  Head: Normocephalic and atraumatic.  Cardiovascular: Normal rate and regular rhythm.   No murmur heard. Pulmonary/Chest: Effort normal and breath sounds normal. No respiratory distress.  Abdominal: Soft. There is no rebound and no guarding.  Right flank tenderness  Musculoskeletal: She exhibits no edema or tenderness.  Neurological: She is alert and oriented to person, place, and time.  Skin: Skin is warm and dry.  Psychiatric: She has a normal mood and affect. Her behavior is normal.  Nursing note and vitals reviewed.   ED Course  Procedures (including critical care time) Labs Review Labs Reviewed  URINALYSIS, ROUTINE W REFLEX MICROSCOPIC (NOT AT [facility])  CBC  COMPREHENSIVE METABOLIC PANEL  I-STAT BETA HCG BLOOD, ED ([facility], [facility], [facility] ONLY)    Imaging Review Ct Renal Stone Study  12/15/2014   CLINICAL DATA:  44 year old female with right flank pain  EXAM: CT ABDOMEN AND  PELVIS WITHOUT CONTRAST  TECHNIQUE: Multidetector CT imaging of the abdomen and pelvis was performed following the standard protocol without IV contrast.  COMPARISON:  Ultrasound dated 01/07/2014 and CT dated 01/02/2014  FINDINGS: Evaluation of this exam is limited in the absence of intravenous contrast.  The visualized lung bases are clear. No intra-abdominal free air or free fluid.  Cholecystectomy. The liver, pancreas, spleen, adrenal glands appear unremarkable. Incompletely characterized stable appearing left renal hypodense lesion previously described a cyst. There is no hydronephrosis or nephrolithiasis on either side. There is mild perinephric stranding, nonspecific. Correlation with urinalysis recommended to exclude UTI. The visualized ureters and urinary bladder appear unremarkable. Hysterectomy.  Moderate stool throughout the colon. No evidence of bowel obstruction or inflammation. Normal appendix.  The visualized abdominal aorta and IVC are grossly unremarkable. No portal venous gas identified. There is no lymphadenopathy. Small fat containing umbilical hernia. Degenerative changes of the spine. A stimulator device is noted in subcutaneous soft tissues of the left gluteal region with wire extending into the left posterior pelvis  IMPRESSION: No hydronephrosis or nephrolithiasis.  No evidence of bowel obstruction or inflammation.  Normal appendix.   Electronically Signed   By: Elgie Collard M.D.   On: 12/15/2014 18:10     EKG Interpretation None      MDM   Final diagnoses:  Acute right flank pain    Pt with hx/o kidney stones here for right flank pain.  UA not c/w UTI, CT without evidence of obstructing stone.  Discussed with pt findings of constipation, unclear source of flank pain.  Recommend home care, close pcp follow up, return precautions were discussed.      Tilden Fossa, MD 12/15/14 [phone]

## 2014-12-15 NOTE — Discharge Instructions (Signed)
Flank Pain °Flank pain refers to pain that is located on the side of the body between the upper abdomen and the back. The pain may occur over a short period of time (acute) or may be long-term or reoccurring (chronic). It may be mild or severe. Flank pain can be caused by many things. °CAUSES  °Some of the more common causes of flank pain include: °· Muscle strains.   °· Muscle spasms.   °· A disease of your spine (vertebral disk disease).   °· A lung infection (pneumonia).   °· Fluid around your lungs (pulmonary edema).   °· A kidney infection.   °· Kidney stones.   °· A very painful skin rash caused by the chickenpox virus (shingles).   °· Gallbladder disease.   °HOME CARE INSTRUCTIONS  °Home care will depend on the cause of your pain. In general, °· Rest as directed by your caregiver. °· Drink enough fluids to keep your urine clear or pale yellow. °· Only take over-the-counter or prescription medicines as directed by your caregiver. Some medicines may help relieve the pain. °· Tell your caregiver about any changes in your pain. °· Follow up with your caregiver as directed. °SEEK IMMEDIATE MEDICAL CARE IF:  °· Your pain is not controlled with medicine.   °· You have new or worsening symptoms. °· Your pain increases.   °· You have abdominal pain.   °· You have shortness of breath.   °· You have persistent nausea or vomiting.   °· You have swelling in your abdomen.   °· You feel faint or pass out.   °· You have blood in your urine. °· You have a fever or persistent symptoms for more than 2-3 days. °· You have a fever and your symptoms suddenly get worse. °MAKE SURE YOU:  °· Understand these instructions. °· Will watch your condition. °· Will get help right away if you are not doing well or get worse. °Document Released: 07/04/2005 Document Revised: 02/05/2012 Document Reviewed: 12/26/2011 °ExitCare® Patient Information ©2015 [employer]. This information is not intended to replace advice given to you by your  health care provider. Make sure you discuss any questions you have with your health care provider. ° °

## 2014-12-15 NOTE — ED Notes (Signed)
Pt reports she was [facility] here from [facility] d/t R flank pain since yesterday.  Pt with hx of IC.  Reports constant severe pain in her R flank with dysuria.  Reports hematuria which UC reported to her.

## 2014-12-15 NOTE — ED Notes (Signed)
Nurse drawing labs. 

## 2015-03-29 ENCOUNTER — Emergency Department (HOSPITAL_COMMUNITY): Payer: Medicare Other

## 2015-03-29 ENCOUNTER — Encounter (HOSPITAL_COMMUNITY): Payer: Self-pay | Admitting: Emergency Medicine

## 2015-03-29 ENCOUNTER — Emergency Department (HOSPITAL_COMMUNITY)
Admission: EM | Admit: 2015-03-29 | Discharge: 2015-03-29 | Disposition: A | Discharge status: Home / Self Care | Payer: Medicare Other | Attending: Emergency Medicine | Admitting: Emergency Medicine

## 2015-03-29 DIAGNOSIS — R253 Fasciculation: Secondary | ICD-10-CM | POA: Insufficient documentation

## 2015-03-29 DIAGNOSIS — E119 Type 2 diabetes mellitus without complications: Secondary | ICD-10-CM | POA: Diagnosis not present

## 2015-03-29 DIAGNOSIS — Z8669 Personal history of other diseases of the nervous system and sense organs: Secondary | ICD-10-CM | POA: Insufficient documentation

## 2015-03-29 DIAGNOSIS — R51 Headache: Secondary | ICD-10-CM | POA: Diagnosis present

## 2015-03-29 DIAGNOSIS — R519 Headache, unspecified: Secondary | ICD-10-CM

## 2015-03-29 DIAGNOSIS — G43809 Other migraine, not intractable, without status migrainosus: Secondary | ICD-10-CM | POA: Insufficient documentation

## 2015-03-29 DIAGNOSIS — M797 Fibromyalgia: Secondary | ICD-10-CM | POA: Insufficient documentation

## 2015-03-29 DIAGNOSIS — R251 Tremor, unspecified: Secondary | ICD-10-CM | POA: Insufficient documentation

## 2015-03-29 DIAGNOSIS — Z720 Tobacco use: Secondary | ICD-10-CM | POA: Insufficient documentation

## 2015-03-29 DIAGNOSIS — Z8719 Personal history of other diseases of the digestive system: Secondary | ICD-10-CM | POA: Insufficient documentation

## 2015-03-29 DIAGNOSIS — Z87442 Personal history of urinary calculi: Secondary | ICD-10-CM | POA: Insufficient documentation

## 2015-03-29 DIAGNOSIS — Z87448 Personal history of other diseases of urinary system: Secondary | ICD-10-CM | POA: Insufficient documentation

## 2015-03-29 DIAGNOSIS — M1990 Unspecified osteoarthritis, unspecified site: Secondary | ICD-10-CM | POA: Insufficient documentation

## 2015-03-29 MED ORDER — DIPHENHYDRAMINE HCL 50 MG/ML IJ SOLN
25.0000 mg | Freq: Once | INTRAMUSCULAR | Status: AC
  Administered 2015-03-29: 25 mg via INTRAVENOUS
  Filled 2015-03-29: qty 1

## 2015-03-29 MED ORDER — HYDROMORPHONE HCL 1 MG/ML IJ SOLN
1.0000 mg | Freq: Once | INTRAMUSCULAR | Status: AC
  Administered 2015-03-29: 1 mg via INTRAVENOUS
  Filled 2015-03-29: qty 1

## 2015-03-29 MED ORDER — DEXAMETHASONE SODIUM PHOSPHATE 10 MG/ML IJ SOLN
10.0000 mg | Freq: Once | INTRAMUSCULAR | Status: AC
  Administered 2015-03-29: 10 mg via INTRAVENOUS
  Filled 2015-03-29: qty 1

## 2015-03-29 MED ORDER — KETOROLAC TROMETHAMINE 30 MG/ML IJ SOLN
30.0000 mg | Freq: Once | INTRAMUSCULAR | Status: AC
  Administered 2015-03-29: 30 mg via INTRAVENOUS
  Filled 2015-03-29: qty 1

## 2015-03-29 MED ORDER — SODIUM CHLORIDE 0.9 % IV SOLN
INTRAVENOUS | Status: DC
  Administered 2015-03-29: 15:00:00 via INTRAVENOUS

## 2015-03-29 MED ORDER — METOCLOPRAMIDE HCL 5 MG/ML IJ SOLN
10.0000 mg | Freq: Once | INTRAMUSCULAR | Status: AC
  Administered 2015-03-29: 10 mg via INTRAVENOUS
  Filled 2015-03-29: qty 2

## 2015-03-29 MED ORDER — DIPHENHYDRAMINE HCL 50 MG/ML IJ SOLN: 25.0000 mg | Freq: Once | INTRAMUSCULAR | Status: DC

## 2015-03-29 MED ORDER — ONDANSETRON HCL 4 MG/2ML IJ SOLN
4.0000 mg | Freq: Once | INTRAMUSCULAR | Status: AC
  Administered 2015-03-29: 4 mg via INTRAVENOUS
  Filled 2015-03-29: qty 2

## 2015-03-29 NOTE — ED Notes (Signed)
Pt has a medtronic stimulator for bladder issues, pt has [employer] card, charge got the phone number for the card to inquire about MRI capabilities. [employer] faxed over paperwork. Charge faxed this paperwork to MRI. They confirmed pt can have MRI, but stimulator has to be turned off. Pt does not have her remote control, is calling her husband to have him bring the remote.

## 2015-03-29 NOTE — Discharge Instructions (Signed)
Workup for the headache to include MRI without any acute findings. The Decadron you received should help resolve the headache over the next 24 hours. Make an appointment to follow-up with your regular doctor. Return for any new or worse symptoms.

## 2015-03-29 NOTE — ED Notes (Signed)
Pt transferred to MRI

## 2015-03-29 NOTE — ED Notes (Signed)
Pt states that her "entire head hurts but i have a flutter just on the left side"  Pt states that headache started on SUnday and flutter started last night. Pt states that lip started twitching as well as the head fluttering.

## 2015-03-29 NOTE — ED Provider Notes (Signed)
CSN: [id number]     Arrival date & time 03/29/15  1306 History   First MD Initiated Contact with Patient 03/29/15 1334     Chief Complaint  Patient presents with  . Headache  . lip twitching      (Consider location/radiation/quality/duration/timing/severity/associated sxs/prior Treatment) HPI Patient reports she has a headache this started 3 days ago. She reports that is all of the back of her head and up to her temples. She states it seemed to start like a regular headache but then progressed to be severe. She reports yesterday evening she started getting a pulsating sensation on the side of her left temple in conjunction with involuntary twitching of her lower lip. She has not developed loss of consciousness. She has not had visual changes. She denies gait impairment or focal weakness or numbness. She has not had recent fever or neck stiffness. Eyes constitutional symptoms of generalized myalgia or fever. Past Medical History  Diagnosis Date  . Interstitial cystitis   . RSD (reflex sympathetic dystrophy)   . PONV (postoperative nausea and vomiting)   . PONV (postoperative nausea and vomiting)   . Arthritis   . GERD (gastroesophageal reflux disease)   . Fibromyalgia   . History of kidney stones   . Diabetes mellitus without complication (HCC)     diet controlled  . Biliary dyskinesia    Past Surgical History  Procedure Laterality Date  . Abdominal hysterectomy    . Cesarean section    . Tonsillectomy    . Anterior cruciate ligament repair    . Carpal tunnel release Bilateral   . Interstim implant placement  Feb 2010    bladder  . Hip arthroscopy Left 10/28/2012    Procedure: ARTHROSCOPY HIP- left;  Surgeon: Loreta Aveaniel F Murphy, MD;  [facility];  Service: Orthopedics;  Laterality: Left;  . Cholecystectomy N/A 01/25/2014    Procedure: LAPAROSCOPIC CHOLECYSTECTOMY;  Surgeon: Abigail Miyamotoouglas Blackman, MD;  Location: [facility];  Service: General;  Laterality: N/A;   Family History  Problem  Relation Age of Onset  . Seizures    . Stroke     Social History  Substance Use Topics  . Smoking status: Current Every Day Smoker -- 0.50 packs/day for 27 years  . Smokeless tobacco: None  . Alcohol Use: No   OB History    No data available     Review of Systems 10 Systems reviewed and are negative for acute change except as noted in the HPI.    Allergies  Demerol; Morphine and related; Other; and Tape  Home Medications   Prior to Admission medications   Medication Sig Start Date End Date Taking? Authorizing Provider  amitriptyline (ELAVIL) 25 MG tablet Take 25 mg by mouth at bedtime.   Yes Historical Provider, MD  hydrOXYzine (ATARAX/VISTARIL) 25 MG tablet Take 25 mg by mouth at bedtime.   Yes Historical Provider, MD  naproxen (NAPROSYN) 500 MG tablet Take 1 tablet (500 mg total) by mouth 2 (two) times daily. Patient not taking: Reported on 12/15/2014 07/31/14   Joycie PeekBenjamin Cartner, PA-C   BP 140/85 mmHg  Pulse 85  Temp(Src) 98.5 F (36.9 C) (Oral)  Resp 18  SpO2 100% Physical Exam  Constitutional: She is oriented to person, place, and time. She appears well-developed and well-nourished.  HENT:  Head: Normocephalic and atraumatic.  Right Ear: External ear normal.  Left Ear: External ear normal.  Nose: Nose normal.  Mouth/Throat: Oropharynx is clear and moist.  Eyes: EOM are normal. Pupils  are equal, round, and reactive to light.  Neck: Neck supple.  Cardiovascular: Normal rate, regular rhythm, normal heart sounds and intact distal pulses.   Pulmonary/Chest: Effort normal and breath sounds normal.  Abdominal: Soft. Bowel sounds are normal. She exhibits no distension. There is no tenderness.  Musculoskeletal: Normal range of motion. She exhibits no edema.  Neurological: She is alert and oriented to person, place, and time. She has normal strength. No cranial nerve deficit. She exhibits normal muscle tone. Coordination normal. GCS eye subscore is 4. GCS verbal subscore is  5. GCS motor subscore is 6.  Patient does have some fine, rhythmic tremor of her lower lip.  Skin: Skin is warm, dry and intact.  Psychiatric: She has a normal mood and affect.    ED Course  Procedures (including critical care time) Labs Review Labs Reviewed - No data to display  Imaging Review No results found. I have personally reviewed and evaluated these images and lab results as part of my medical decision-making.   EKG Interpretation None      MDM   Final diagnoses:  None   Patient final disposition pending MRI/MRA results. At this time no focal neurologic deficits, no signs infections illness. If MRI no acute findings, my impression is for complex migraine. Patient care will be completed by Dr. Deretha Emory.    Arby Barrette, MD 03/30/15 [phone]

## 2015-03-29 NOTE — ED Notes (Signed)
MRI alerted to pt ready since husband arrival. They stated they would get to her soon

## 2015-03-29 NOTE — ED Provider Notes (Signed)
Results for orders placed or performed during the hospital encounter of 12/15/14  Urinalysis, Routine w reflex microscopic-may I&O cath if menses (not at [facility])  Result Value Ref Range   Color, Urine YELLOW YELLOW   APPearance CLEAR CLEAR   Specific Gravity, Urine 1.012 1.005 - 1.030   pH 6.0 5.0 - 8.0   Glucose, UA NEGATIVE NEGATIVE mg/dL   Hgb urine dipstick NEGATIVE NEGATIVE   Bilirubin Urine NEGATIVE NEGATIVE   Ketones, ur NEGATIVE NEGATIVE mg/dL   Protein, ur NEGATIVE NEGATIVE mg/dL   Urobilinogen, UA 0.2 0.0 - 1.0 mg/dL   Nitrite NEGATIVE NEGATIVE   Leukocytes, UA NEGATIVE NEGATIVE  CBC  Result Value Ref Range   WBC 7.0 4.0 - 10.5 K/uL   RBC 4.49 3.87 - 5.11 MIL/uL   Hemoglobin 14.3 12.0 - 15.0 g/dL   HCT 16.1 09.6 - 04.5 %   MCV 91.5 78.0 - 100.0 fL   MCH 31.8 26.0 - 34.0 pg   MCHC 34.8 30.0 - 36.0 g/dL   RDW 40.9 81.1 - 91.4 %   Platelets 242 150 - 400 K/uL  Comprehensive metabolic panel  Result Value Ref Range   Sodium 141 135 - 145 mmol/L   Potassium 3.8 3.5 - 5.1 mmol/L   Chloride 107 101 - 111 mmol/L   CO2 26 22 - 32 mmol/L   Glucose, Bld 69 65 - 99 mg/dL   BUN 15 6 - 20 mg/dL   Creatinine, Ser 7.82 0.44 - 1.00 mg/dL   Calcium 9.8 8.9 - 95.6 mg/dL   Total Protein 7.8 6.5 - 8.1 g/dL   Albumin 4.8 3.5 - 5.0 g/dL   AST 21 15 - 41 U/L   ALT 27 14 - 54 U/L   Alkaline Phosphatase 41 38 - 126 U/L   Total Bilirubin 0.6 0.3 - 1.2 mg/dL   GFR calc non Af Amer >60 >60 mL/min   GFR calc Af Amer >60 >60 mL/min   Anion gap 8 5 - 15  I-Stat beta hCG blood, ED ([facility], [facility], [facility] only)  Result Value Ref Range   I-stat hCG, quantitative <5.0 <5 mIU/mL   Comment 3           Mr Angiogram Head Wo Contrast  03/29/2015  CLINICAL DATA:  Awoke today with fall full headache in fluttering on the left side of the head. Facial numbness and twitching. EXAM: MRI HEAD WITHOUT CONTRAST MRA HEAD WITHOUT CONTRAST TECHNIQUE: Multiplanar, multiecho pulse sequences of the brain and surrounding  structures were obtained without intravenous contrast. Angiographic images of the head were obtained using MRA technique without contrast. COMPARISON:  CT head without contrast 10/15/2004. FINDINGS: MRI HEAD FINDINGS The diffusion-weighted images demonstrate no evidence for acute or subacute infarction. No acute hemorrhage or mass lesion is present. There is no significant white matter disease. The ventricles are of normal size. No significant extraaxial fluid collection is present. The internal auditory canals are within normal limits. The brainstem and cerebellum are normal. Flow is present in the major intracranial arteries. The globes and orbits are intact. The paranasal sinuses are clear. There is some fluid in the left mastoid air cells. No obstructing nasopharyngeal lesion is evident. MRA HEAD FINDINGS The internal carotid arteries are within normal limits from the high cervical segments through the ICA termini bilaterally. The A1 and M1 segments are normal. The anterior communicating artery is patent. Vertebral arteries are within normal limits bilaterally. The vertebral arteries are codominant. The right PICA origin is visualized and  normal. The left AICA is dominant. The right posterior cerebral artery originates from the basilar tip. The left posterior cerebral artery is of fetal type. IMPRESSION: 1. Normal MRI appearance of the brain. 2. Normal MRA circle of Willis. No significant proximal stenosis, aneurysm, or branch vessel occlusion is present. Electronically Signed   By: Marin Robertshristopher  Mattern M.D.   On: 03/29/2015 18:54   Mr Brain Wo Contrast (neuro Protocol)  03/29/2015  CLINICAL DATA:  Awoke today with fall full headache in fluttering on the left side of the head. Facial numbness and twitching. EXAM: MRI HEAD WITHOUT CONTRAST MRA HEAD WITHOUT CONTRAST TECHNIQUE: Multiplanar, multiecho pulse sequences of the brain and surrounding structures were obtained without intravenous contrast. Angiographic  images of the head were obtained using MRA technique without contrast. COMPARISON:  CT head without contrast 10/15/2004. FINDINGS: MRI HEAD FINDINGS The diffusion-weighted images demonstrate no evidence for acute or subacute infarction. No acute hemorrhage or mass lesion is present. There is no significant white matter disease. The ventricles are of normal size. No significant extraaxial fluid collection is present. The internal auditory canals are within normal limits. The brainstem and cerebellum are normal. Flow is present in the major intracranial arteries. The globes and orbits are intact. The paranasal sinuses are clear. There is some fluid in the left mastoid air cells. No obstructing nasopharyngeal lesion is evident. MRA HEAD FINDINGS The internal carotid arteries are within normal limits from the high cervical segments through the ICA termini bilaterally. The A1 and M1 segments are normal. The anterior communicating artery is patent. Vertebral arteries are within normal limits bilaterally. The vertebral arteries are codominant. The right PICA origin is visualized and normal. The left AICA is dominant. The right posterior cerebral artery originates from the basilar tip. The left posterior cerebral artery is of fetal type. IMPRESSION: 1. Normal MRI appearance of the brain. 2. Normal MRA circle of Willis. No significant proximal stenosis, aneurysm, or branch vessel occlusion is present. Electronically Signed   By: Marin Robertshristopher  Mattern M.D.   On: 03/29/2015 18:54    Patient's MRI/MRA without any acute findings. Headache may be of migraine type. Patient with improvement here with re-dosing of antinausea medicine and Benadryl patient also received Decadron and received 1 mg of hydromorphone with improvement in the headache.  Patient after receiving the Decadron had a burning sensation which was probably a side effect of the medicine not consistent with an allergic reaction.  Patient will follow-up with her  regular doctor.  Vanetta MuldersScott Garmon Dehn, MD 03/29/15 2032

## 2015-07-23 ENCOUNTER — Encounter (HOSPITAL_BASED_OUTPATIENT_CLINIC_OR_DEPARTMENT_OTHER): Payer: Self-pay | Admitting: *Deleted

## 2015-07-23 ENCOUNTER — Emergency Department (HOSPITAL_BASED_OUTPATIENT_CLINIC_OR_DEPARTMENT_OTHER)
Admission: EM | Admit: 2015-07-23 | Discharge: 2015-07-23 | Disposition: A | Discharge status: Home / Self Care | Payer: Medicare Other | Attending: Emergency Medicine | Admitting: Emergency Medicine

## 2015-07-23 ENCOUNTER — Emergency Department (HOSPITAL_BASED_OUTPATIENT_CLINIC_OR_DEPARTMENT_OTHER): Payer: Medicare Other

## 2015-07-23 DIAGNOSIS — M545 Low back pain: Secondary | ICD-10-CM | POA: Insufficient documentation

## 2015-07-23 DIAGNOSIS — Z8719 Personal history of other diseases of the digestive system: Secondary | ICD-10-CM | POA: Insufficient documentation

## 2015-07-23 DIAGNOSIS — M546 Pain in thoracic spine: Secondary | ICD-10-CM | POA: Insufficient documentation

## 2015-07-23 DIAGNOSIS — Z87442 Personal history of urinary calculi: Secondary | ICD-10-CM | POA: Insufficient documentation

## 2015-07-23 DIAGNOSIS — R197 Diarrhea, unspecified: Secondary | ICD-10-CM | POA: Insufficient documentation

## 2015-07-23 DIAGNOSIS — M1990 Unspecified osteoarthritis, unspecified site: Secondary | ICD-10-CM | POA: Insufficient documentation

## 2015-07-23 DIAGNOSIS — R300 Dysuria: Secondary | ICD-10-CM | POA: Insufficient documentation

## 2015-07-23 DIAGNOSIS — E119 Type 2 diabetes mellitus without complications: Secondary | ICD-10-CM | POA: Insufficient documentation

## 2015-07-23 DIAGNOSIS — R509 Fever, unspecified: Secondary | ICD-10-CM | POA: Insufficient documentation

## 2015-07-23 DIAGNOSIS — F17200 Nicotine dependence, unspecified, uncomplicated: Secondary | ICD-10-CM | POA: Diagnosis not present

## 2015-07-23 DIAGNOSIS — R109 Unspecified abdominal pain: Secondary | ICD-10-CM | POA: Diagnosis not present

## 2015-07-23 DIAGNOSIS — R350 Frequency of micturition: Secondary | ICD-10-CM | POA: Insufficient documentation

## 2015-07-23 DIAGNOSIS — M797 Fibromyalgia: Secondary | ICD-10-CM | POA: Insufficient documentation

## 2015-07-23 DIAGNOSIS — M549 Dorsalgia, unspecified: Secondary | ICD-10-CM | POA: Diagnosis present

## 2015-07-23 DIAGNOSIS — Z8669 Personal history of other diseases of the nervous system and sense organs: Secondary | ICD-10-CM | POA: Diagnosis not present

## 2015-07-23 LAB — URINALYSIS, ROUTINE W REFLEX MICROSCOPIC
Bilirubin Urine: NEGATIVE
GLUCOSE, UA: NEGATIVE mg/dL
KETONES UR: NEGATIVE mg/dL
LEUKOCYTES UA: NEGATIVE
NITRITE: NEGATIVE
PH: 5.5 (ref 5.0–8.0)
Protein, ur: NEGATIVE mg/dL
SPECIFIC GRAVITY, URINE: 1.015 (ref 1.005–1.030)

## 2015-07-23 LAB — CBC WITH DIFFERENTIAL/PLATELET
BASOS PCT: 0 %
Basophils Absolute: 0 10*3/uL (ref 0.0–0.1)
EOS ABS: 0.2 10*3/uL (ref 0.0–0.7)
EOS PCT: 3 %
HCT: 41.4 % (ref 36.0–46.0)
Hemoglobin: 14.3 g/dL (ref 12.0–15.0)
Lymphocytes Relative: 54 %
Lymphs Abs: 3.4 10*3/uL (ref 0.7–4.0)
MCH: 31.7 pg (ref 26.0–34.0)
MCHC: 34.5 g/dL (ref 30.0–36.0)
MCV: 91.8 fL (ref 78.0–100.0)
Monocytes Absolute: 0.5 10*3/uL (ref 0.1–1.0)
Monocytes Relative: 7 %
NEUTROS PCT: 36 %
Neutro Abs: 2.3 10*3/uL (ref 1.7–7.7)
PLATELETS: 214 10*3/uL (ref 150–400)
RBC: 4.51 MIL/uL (ref 3.87–5.11)
RDW: 12.7 % (ref 11.5–15.5)
WBC: 6.3 10*3/uL (ref 4.0–10.5)

## 2015-07-23 LAB — BASIC METABOLIC PANEL
ANION GAP: 7 (ref 5–15)
BUN: 19 mg/dL (ref 6–20)
CALCIUM: 9 mg/dL (ref 8.9–10.3)
CO2: 25 mmol/L (ref 22–32)
Chloride: 108 mmol/L (ref 101–111)
Creatinine, Ser: 0.86 mg/dL (ref 0.44–1.00)
GFR calc Af Amer: >60 mL/min (ref >60)
GLUCOSE: 87 mg/dL (ref 65–99)
Potassium: 4.3 mmol/L (ref 3.5–5.1)
SODIUM: 140 mmol/L (ref 135–145)

## 2015-07-23 LAB — URINE MICROSCOPIC-ADD ON

## 2015-07-23 MED ORDER — IBUPROFEN 800 MG PO TABS: 800.0000 mg | ORAL_TABLET | Freq: Three times a day (TID) | ORAL | Status: DC

## 2015-07-23 MED ORDER — SODIUM CHLORIDE 0.9 % IV BOLUS (SEPSIS)
1000.0000 mL | Freq: Once | INTRAVENOUS | Status: AC
  Administered 2015-07-23: 1000 mL via INTRAVENOUS

## 2015-07-23 MED ORDER — OXYCODONE-ACETAMINOPHEN 5-325 MG PO TABS: 1.0000 | ORAL_TABLET | ORAL | Status: DC | PRN

## 2015-07-23 MED ORDER — METHOCARBAMOL 500 MG PO TABS: 500.0000 mg | ORAL_TABLET | Freq: Two times a day (BID) | ORAL | Status: DC

## 2015-07-23 MED ORDER — OXYCODONE-ACETAMINOPHEN 5-325 MG PO TABS
1.0000 | ORAL_TABLET | Freq: Once | ORAL | Status: AC
  Administered 2015-07-23: 1 via ORAL
  Filled 2015-07-23: qty 1

## 2015-07-23 MED ORDER — HYDROMORPHONE HCL 1 MG/ML IJ SOLN
0.5000 mg | Freq: Once | INTRAMUSCULAR | Status: AC
  Administered 2015-07-23: 0.5 mg via INTRAVENOUS
  Filled 2015-07-23: qty 1

## 2015-07-23 NOTE — ED Notes (Signed)
Pt returns from radiology, placed on cont POX monitoring, pain reassessed, pt states feels much better after IV pain administration, family at bedside, safety measures in place. Comfort measures provided

## 2015-07-23 NOTE — ED Notes (Signed)
Patient transported to CT via stretcher, sr x 2 up  

## 2015-07-23 NOTE — ED Notes (Signed)
Pain increases with movement.

## 2015-07-23 NOTE — ED Notes (Signed)
Presents today with left mid back pain, onset approx 1430hrs yesterday. Pain radiates to left flank, left side area. Pt is tender to palpation to area, restless on stretcher at times, attempting to assume position of comfort.

## 2015-07-23 NOTE — Discharge Instructions (Signed)
1. Medications: ibuprofen, robaxin, pain medication, usual home medications 2. Treatment: rest, drink plenty of fluids 3. Follow Up: please followup with your primary doctor for discussion of your diagnoses and further evaluation after today's visit; please return to the ER for increased pain, loss of control of your bowel or bladder, new or worsening symptoms   Back Pain, Adult Back pain is very common. The pain often gets better over time. The cause of back pain is usually not dangerous. Most people can learn to manage their back pain on their own.  HOME CARE  Watch your back pain for any changes. The following actions may help to lessen any pain you are feeling:  Stay active. Start with short walks on flat ground if you can. Try to walk farther each day.  Exercise regularly as told by your doctor. Exercise helps your back heal faster. It also helps avoid future injury by keeping your muscles strong and flexible.  Do not sit, drive, or stand in one place for more than 30 minutes.  Do not stay in bed. Resting more than 1-2 days can slow down your recovery.  Be careful when you bend or lift an object. Use good form when lifting:  Bend at your knees.  Keep the object close to your body.  Do not twist.  Sleep on a firm mattress. Lie on your side, and bend your knees. If you lie on your back, put a pillow under your knees.  Take medicines only as told by your doctor.  Put ice on the injured area.  Put ice in a plastic bag.  Place a towel between your skin and the bag.  Leave the ice on for 20 minutes, 2-3 times a day for the first 2-3 days. After that, you can switch between ice and heat packs.  Avoid feeling anxious or stressed. Find good ways to deal with stress, such as exercise.  Maintain a healthy weight. Extra weight puts stress on your back. GET HELP IF:   You have pain that does not go away with rest or medicine.  You have worsening pain that goes down into your legs  or buttocks.  You have pain that does not get better in one week.  You have pain at night.  You lose weight.  You have a fever or chills. GET HELP RIGHT AWAY IF:   You cannot control when you poop (bowel movement) or pee (urinate).  Your arms or legs feel weak.  Your arms or legs lose feeling (numbness).  You feel sick to your stomach (nauseous) or throw up (vomit).  You have belly (abdominal) pain.  You feel like you may pass out (faint).   This information is not intended to replace advice given to you by your health care provider. Make sure you discuss any questions you have with your health care provider.   Document Released: 10/30/2007 Document Revised: 06/03/2014 Document Reviewed: 09/14/2013 Elsevier Interactive Patient Education [employer]

## 2015-07-23 NOTE — ED Provider Notes (Signed)
CSN: [id number]     Arrival date & time 07/23/15  1309 History   First MD Initiated Contact with Patient 07/23/15 1406     Chief Complaint  Patient presents with  . Back Pain    HPI   Barbara Wu is a 45 y.o. female with a PMH of interstitial cystitis, nephrolithiasis, DM who presents to the ED with left-sided back and flank pain, which she states started yesterday afternoon and has been constant since that time. She reports movement exacerbates her pain. She has not tried anything for pain relief. She states she is a history of kidney stones that her symptoms feel similar. She notes subjective fever and chills. She also reports dysuria and urinary frequency, though states this is chronic due to her history of interstitial cystitis. She denies nausea and vomiting. She reports diarrhea, though denies hematochezia or melena. She denies bowel or bladder incontinence, saddle anesthesia, numbness, weakness, paresthesia, history of malignancy, IV drug use, anticoagulant use, recent injury.   Past Medical History  Diagnosis Date  . Interstitial cystitis   . RSD (reflex sympathetic dystrophy)   . PONV (postoperative nausea and vomiting)   . PONV (postoperative nausea and vomiting)   . Arthritis   . GERD (gastroesophageal reflux disease)   . Fibromyalgia   . History of kidney stones   . Diabetes mellitus without complication (HCC)     diet controlled  . Biliary dyskinesia    Past Surgical History  Procedure Laterality Date  . Abdominal hysterectomy    . Cesarean section    . Tonsillectomy    . Anterior cruciate ligament repair    . Carpal tunnel release Bilateral   . Interstim implant placement  Feb 2010    bladder, Medtronic, serial # [id number], model [id number], info # [id number]  . Hip arthroscopy Left 10/28/2012    Procedure: ARTHROSCOPY HIP- left;  Surgeon: Loreta Ave, MD;  Location: [facility] OR;  Service: Orthopedics;  Laterality: Left;  . Cholecystectomy N/A 01/25/2014    Procedure:  LAPAROSCOPIC CHOLECYSTECTOMY;  Surgeon: Abigail Miyamoto, MD;  Location: [facility] OR[facility];  Service: General;  Laterality: N/A;   Family History  Problem Relation Age of Onset  . Seizures    . Stroke     Social History  Substance Use Topics  . Smoking status: Current Every Day Smoker -- 0.50 packs/day for 27 years  . Smokeless tobacco: None  . Alcohol Use: No   OB History    No data available       Review of Systems  Constitutional: Positive for fever and chills.  Gastrointestinal: Positive for abdominal pain and diarrhea. Negative for nausea, vomiting, constipation and blood in stool.  Genitourinary: Positive for dysuria, frequency and flank pain. Negative for urgency and hematuria.  Musculoskeletal: Positive for back pain.  All other systems reviewed and are negative.     Allergies  Demerol; Morphine and related; Other; and Tape  Home Medications   Prior to Admission medications   Medication Sig Start Date End Date Taking? Authorizing Provider  amitriptyline (ELAVIL) 25 MG tablet Take 25 mg by mouth at bedtime.    Historical Provider, MD  hydrOXYzine (ATARAX/VISTARIL) 25 MG tablet Take 25 mg by mouth at bedtime.    Historical Provider, MD  ibuprofen (ADVIL,MOTRIN) 800 MG tablet Take 1 tablet (800 mg total) by mouth 3 (three) times daily. 07/23/15   Mady Gemma, PA-C  methocarbamol (ROBAXIN) 500 MG tablet Take 1 tablet (500 mg total) by mouth 2 (two)  times daily. 07/23/15   Mady Gemma, PA-C  naproxen (NAPROSYN) 500 MG tablet Take 1 tablet (500 mg total) by mouth 2 (two) times daily. Patient not taking: Reported on 12/15/2014 07/31/14   Joycie Peek, PA-C  oxyCODONE-acetaminophen (PERCOCET/ROXICET) 5-325 MG tablet Take 1-2 tablets by mouth every 4 (four) hours as needed for severe pain. 07/23/15   Mady Gemma, PA-C    BP 145/67 mmHg  Pulse 56  Temp(Src) 98.4 F (36.9 C) (Oral)  Resp 20  Ht  (1.549 m)  Wt 95.255 kg  BMI 39.70 kg/m2  SpO2  100% Physical Exam  Constitutional: She is oriented to person, place, and time. She appears well-developed and well-nourished. No distress.  HENT:  Head: Normocephalic and atraumatic.  Right Ear: External ear normal.  Left Ear: External ear normal.  Nose: Nose normal.  Mouth/Throat: Uvula is midline, oropharynx is clear and moist and mucous membranes are normal.  Eyes: Conjunctivae, EOM and lids are normal. Pupils are equal, round, and reactive to light. Right eye exhibits no discharge. Left eye exhibits no discharge. No scleral icterus.  Neck: Normal range of motion. Neck supple.  Cardiovascular: Normal rate, regular rhythm, normal heart sounds, intact distal pulses and normal pulses.   Pulmonary/Chest: Effort normal and breath sounds normal. No respiratory distress. She has no wheezes. She has no rales.  Abdominal: Soft. Normal appearance and bowel sounds are normal. She exhibits no distension and no mass. There is no tenderness. There is no rigidity, no rebound and no guarding.  Left-sided CVA tenderness.  Musculoskeletal: Normal range of motion. She exhibits no edema or tenderness.  TTP of left thoracic and lumbar paraspinal muscles. No midline tenderness, step-off, or deformity.  Neurological: She is alert and oriented to person, place, and time. She has normal strength and normal reflexes. No sensory deficit.  Skin: Skin is warm, dry and intact. No rash noted. She is not diaphoretic. No erythema. No pallor.  Psychiatric: She has a normal mood and affect. Her speech is normal and behavior is normal.  Nursing note and vitals reviewed.   ED Course  Procedures (including critical care time)  Labs Review Labs Reviewed  URINALYSIS, ROUTINE W REFLEX MICROSCOPIC (NOT AT [facility]) - Abnormal; Notable for the following:    Hgb urine dipstick TRACE (*)    All other components within normal limits  URINE MICROSCOPIC-ADD ON - Abnormal; Notable for the following:    Squamous Epithelial / LPF 0-5  (*)    Bacteria, UA MANY (*)    All other components within normal limits  CBC WITH DIFFERENTIAL/PLATELET  BASIC METABOLIC PANEL    Imaging Review Ct Renal Stone Study  07/23/2015  CLINICAL DATA:  Left flank pain for 1 day EXAM: CT ABDOMEN AND PELVIS WITHOUT CONTRAST TECHNIQUE: Multidetector CT imaging of the abdomen and pelvis was performed following the standard protocol without IV contrast. COMPARISON:  12/15/2014 FINDINGS: Lung bases are free of acute infiltrate or sizable effusion. The liver demonstrates some geographic decreased attenuation likely related to fatty infiltration. No focal mass lesion is seen. The gallbladder has been surgically removed. The spleen, adrenal glands and pancreas are within normal limits. The right kidney demonstrates a tiny nonobstructing lower pole renal calculus. The left kidney demonstrates scattered small nonobstructing stones. Stable upper pole renal cyst is noted. The ureters are within normal limits bilaterally. The bladder is decompressed. No free pelvic fluid is seen. The uterus has been surgically removed. Appendix is within normal limits. The bony structures are  within normal limits. A stimulation device is noted in the left pelvis. IMPRESSION: Tiny nonobstructing stones bilaterally. No obstructive changes are seen. Electronically Signed   By: Alcide Clever M.D.   On: 07/23/2015 15:41   I have personally reviewed and evaluated these images and lab results as part of my medical decision-making.   EKG Interpretation None      MDM   Final diagnoses:  Back pain, unspecified location    45 year old female presents with left-sided back and flank pain, which she states started yesterday. Notes chronic dysuria and urinary frequency d/t interstitial cystitis. Denies bowel or bladder incontinence, saddle anesthesia, numbness, weakness, paresthesia, history of malignancy, IV drug use, anticoagulant use, recent injury.  Patient is afebrile. Vital signs  stable. On exam, she has tenderness to palpation to her left lumbar paraspinal muscles. No midline tenderness, step-off, or deformity. Strength, sensation, DTRs intact. Patient ambulates without difficulty. Abdomen soft, nontender, nondistended. No rebound, guarding, or masses. Left sided CVA tenderness present.  Patient given pain medication with subsequent symptom improvement.  CBC negative for leukocytosis or anemia. BMP unremarkable. UA remarkable for trace hemoglobin, no infection. CT renal stone study remarkable for tiny nonobstructing stones, no obstructive changes seen. Discussed findings with patient. She is nontoxic and well-appearing, feel she is stable for discharge at this time. Symptoms likely muscular. Will treat with anti-inflammatory and muscle relaxant. Patient to follow up with PCP for further evaluation and management. Strict return precautions discussed. Patient verbalizes her understanding and is in agreement with plan.  BP 145/67 mmHg  Pulse 56  Temp(Src) 98.4 F (36.9 C) (Oral)  Resp 20  Ht  (1.549 m)  Wt 95.255 kg  BMI 39.70 kg/m2  SpO2 100%       Mady Gemma, PA-C 07/23/15 1704  Rolan Bucco, MD 07/24/15 1524

## 2015-07-23 NOTE — ED Notes (Signed)
Left sided mid back pain since yesterday afternoon.  States that it feels like a bruise but denies injury.  Does reports dysuria.

## 2015-11-23 ENCOUNTER — Encounter (HOSPITAL_COMMUNITY)
Admission: EM | Disposition: A | Discharge status: Home / Self Care | Payer: Self-pay | Source: Home / Self Care | Attending: Emergency Medicine

## 2015-11-23 ENCOUNTER — Emergency Department (HOSPITAL_COMMUNITY): Payer: Medicare Other | Admitting: Certified Registered Nurse Anesthetist

## 2015-11-23 ENCOUNTER — Emergency Department (HOSPITAL_COMMUNITY): Payer: Medicare Other

## 2015-11-23 ENCOUNTER — Encounter (HOSPITAL_COMMUNITY): Payer: Self-pay | Admitting: Emergency Medicine

## 2015-11-23 ENCOUNTER — Emergency Department (HOSPITAL_COMMUNITY)
Admission: EM | Admit: 2015-11-23 | Discharge: 2015-11-23 | Disposition: A | Discharge status: Home / Self Care | Payer: Medicare Other | Attending: Emergency Medicine | Admitting: Emergency Medicine

## 2015-11-23 DIAGNOSIS — M797 Fibromyalgia: Secondary | ICD-10-CM | POA: Insufficient documentation

## 2015-11-23 DIAGNOSIS — F17210 Nicotine dependence, cigarettes, uncomplicated: Secondary | ICD-10-CM | POA: Insufficient documentation

## 2015-11-23 DIAGNOSIS — E669 Obesity, unspecified: Secondary | ICD-10-CM | POA: Insufficient documentation

## 2015-11-23 DIAGNOSIS — Z87442 Personal history of urinary calculi: Secondary | ICD-10-CM | POA: Insufficient documentation

## 2015-11-23 DIAGNOSIS — Z79899 Other long term (current) drug therapy: Secondary | ICD-10-CM | POA: Diagnosis not present

## 2015-11-23 DIAGNOSIS — Z96642 Presence of left artificial hip joint: Secondary | ICD-10-CM | POA: Insufficient documentation

## 2015-11-23 DIAGNOSIS — E119 Type 2 diabetes mellitus without complications: Secondary | ICD-10-CM | POA: Insufficient documentation

## 2015-11-23 DIAGNOSIS — G9050 Complex regional pain syndrome I, unspecified: Secondary | ICD-10-CM | POA: Diagnosis not present

## 2015-11-23 DIAGNOSIS — N202 Calculus of kidney with calculus of ureter: Secondary | ICD-10-CM | POA: Diagnosis present

## 2015-11-23 DIAGNOSIS — N23 Unspecified renal colic: Secondary | ICD-10-CM

## 2015-11-23 HISTORY — PX: CYSTOSCOPY W/ URETERAL STENT PLACEMENT: SHX1429

## 2015-11-23 LAB — URINALYSIS, ROUTINE W REFLEX MICROSCOPIC
Bilirubin Urine: NEGATIVE
GLUCOSE, UA: NEGATIVE mg/dL
Ketones, ur: NEGATIVE mg/dL
LEUKOCYTES UA: NEGATIVE
Nitrite: NEGATIVE
PROTEIN: 30 mg/dL — AB
SPECIFIC GRAVITY, URINE: 1.027 (ref 1.005–1.030)
pH: 5.5 (ref 5.0–8.0)

## 2015-11-23 LAB — CBC WITH DIFFERENTIAL/PLATELET
Basophils Absolute: 0 10*3/uL (ref 0.0–0.1)
Basophils Relative: 0 %
EOS ABS: 0 10*3/uL (ref 0.0–0.7)
EOS PCT: 0 %
HCT: 39.8 % (ref 36.0–46.0)
Hemoglobin: 13.7 g/dL (ref 12.0–15.0)
LYMPHS ABS: 1.6 10*3/uL (ref 0.7–4.0)
Lymphocytes Relative: 18 %
MCH: 31.4 pg (ref 26.0–34.0)
MCHC: 34.4 g/dL (ref 30.0–36.0)
MCV: 91.1 fL (ref 78.0–100.0)
Monocytes Absolute: 0.5 10*3/uL (ref 0.1–1.0)
Monocytes Relative: 6 %
Neutro Abs: 6.7 10*3/uL (ref 1.7–7.7)
Neutrophils Relative %: 76 %
PLATELETS: 228 10*3/uL (ref 150–400)
RBC: 4.37 MIL/uL (ref 3.87–5.11)
RDW: 13 % (ref 11.5–15.5)
WBC: 8.9 10*3/uL (ref 4.0–10.5)

## 2015-11-23 LAB — COMPREHENSIVE METABOLIC PANEL
ALT: 26 U/L (ref 14–54)
AST: 22 U/L (ref 15–41)
Albumin: 4.2 g/dL (ref 3.5–5.0)
Alkaline Phosphatase: 42 U/L (ref 38–126)
Anion gap: 7 (ref 5–15)
BUN: 18 mg/dL (ref 6–20)
CHLORIDE: 114 mmol/L — AB (ref 101–111)
CO2: 20 mmol/L — ABNORMAL LOW (ref 22–32)
CREATININE: 0.83 mg/dL (ref 0.44–1.00)
Calcium: 8.5 mg/dL — ABNORMAL LOW (ref 8.9–10.3)
GFR calc Af Amer: >60 mL/min (ref >60)
Glucose, Bld: 132 mg/dL — ABNORMAL HIGH (ref 65–99)
Potassium: 4.2 mmol/L (ref 3.5–5.1)
Sodium: 141 mmol/L (ref 135–145)
Total Bilirubin: 0.4 mg/dL (ref 0.3–1.2)
Total Protein: 6.8 g/dL (ref 6.5–8.1)

## 2015-11-23 LAB — URINE MICROSCOPIC-ADD ON

## 2015-11-23 SURGERY — CYSTOSCOPY, WITH RETROGRADE PYELOGRAM AND URETERAL STENT INSERTION
Anesthesia: General | Site: Ureter | Laterality: Right

## 2015-11-23 MED ORDER — LIDOCAINE HCL (CARDIAC) 20 MG/ML IV SOLN
INTRAVENOUS | Status: AC
  Filled 2015-11-23: qty 5

## 2015-11-23 MED ORDER — CEFAZOLIN (ANCEF) 1 G IV SOLR: 1.0000 g | INTRAVENOUS | Status: DC

## 2015-11-23 MED ORDER — 0.9 % SODIUM CHLORIDE (POUR BTL) OPTIME
TOPICAL | Status: DC | PRN
  Administered 2015-11-23: 1000 mL

## 2015-11-23 MED ORDER — PROPOFOL 10 MG/ML IV BOLUS
INTRAVENOUS | Status: AC
  Filled 2015-11-23: qty 20

## 2015-11-23 MED ORDER — DIPHENHYDRAMINE HCL 50 MG/ML IJ SOLN
12.5000 mg | Freq: Once | INTRAMUSCULAR | Status: AC
  Administered 2015-11-23: 12.5 mg via INTRAVENOUS
  Filled 2015-11-23: qty 1

## 2015-11-23 MED ORDER — SUCCINYLCHOLINE CHLORIDE 20 MG/ML IJ SOLN
INTRAMUSCULAR | Status: DC | PRN
  Administered 2015-11-23: 100 mg via INTRAVENOUS

## 2015-11-23 MED ORDER — ONDANSETRON HCL 4 MG/2ML IJ SOLN
4.0000 mg | Freq: Once | INTRAMUSCULAR | Status: AC
  Administered 2015-11-23: 4 mg via INTRAVENOUS
  Filled 2015-11-23: qty 2

## 2015-11-23 MED ORDER — CEFAZOLIN SODIUM-DEXTROSE 2-4 GM/100ML-% IV SOLN
2.0000 g | INTRAVENOUS | Status: AC
  Administered 2015-11-23: 2 g via INTRAVENOUS
  Filled 2015-11-23: qty 100

## 2015-11-23 MED ORDER — HYDROMORPHONE HCL 1 MG/ML IJ SOLN
1.0000 mg | Freq: Once | INTRAMUSCULAR | Status: AC
  Administered 2015-11-23: 1 mg via INTRAVENOUS
  Filled 2015-11-23: qty 1

## 2015-11-23 MED ORDER — SULFAMETHOXAZOLE-TRIMETHOPRIM 800-160 MG PO TABS: 1.0000 | ORAL_TABLET | Freq: Two times a day (BID) | ORAL | Status: DC

## 2015-11-23 MED ORDER — SODIUM CHLORIDE 0.9 % IV BOLUS (SEPSIS)
1000.0000 mL | Freq: Once | INTRAVENOUS | Status: AC
  Administered 2015-11-23: 1000 mL via INTRAVENOUS

## 2015-11-23 MED ORDER — SODIUM CHLORIDE 0.9 % IR SOLN
Status: DC | PRN
  Administered 2015-11-23: 4000 mL

## 2015-11-23 MED ORDER — ONDANSETRON HCL 4 MG/2ML IJ SOLN
INTRAMUSCULAR | Status: DC | PRN
  Administered 2015-11-23: 4 mg via INTRAVENOUS

## 2015-11-23 MED ORDER — PROPOFOL 10 MG/ML IV BOLUS
INTRAVENOUS | Status: DC | PRN
Start: 2015-11-23 — End: 2015-11-23
  Administered 2015-11-23: 150 mg via INTRAVENOUS

## 2015-11-23 MED ORDER — PROMETHAZINE HCL 25 MG/ML IJ SOLN
25.0000 mg | Freq: Once | INTRAMUSCULAR | Status: AC
  Administered 2015-11-23: 25 mg via INTRAVENOUS
  Filled 2015-11-23: qty 1

## 2015-11-23 MED ORDER — PHENYLEPHRINE HCL 10 MG/ML IJ SOLN
INTRAMUSCULAR | Status: DC | PRN
  Administered 2015-11-23 (×5): 80 ug via INTRAVENOUS

## 2015-11-23 MED ORDER — FENTANYL CITRATE (PF) 100 MCG/2ML IJ SOLN
INTRAMUSCULAR | Status: AC
  Filled 2015-11-23: qty 2

## 2015-11-23 MED ORDER — IOHEXOL 300 MG/ML  SOLN
INTRAMUSCULAR | Status: DC | PRN
  Administered 2015-11-23: 50 mL

## 2015-11-23 MED ORDER — MIDAZOLAM HCL 5 MG/5ML IJ SOLN
INTRAMUSCULAR | Status: DC | PRN
  Administered 2015-11-23: 2 mg via INTRAVENOUS

## 2015-11-23 MED ORDER — CEFAZOLIN SODIUM-DEXTROSE 2-4 GM/100ML-% IV SOLN
INTRAVENOUS | Status: AC
Start: 2015-11-23 — End: 2015-11-23
  Filled 2015-11-23: qty 100

## 2015-11-23 MED ORDER — PROMETHAZINE HCL 25 MG/ML IJ SOLN
INTRAMUSCULAR | Status: AC
  Filled 2015-11-23: qty 1

## 2015-11-23 MED ORDER — HYDROMORPHONE HCL 2 MG/ML IJ SOLN
2.0000 mg | INTRAMUSCULAR | Status: AC | PRN
  Administered 2015-11-23 (×3): 2 mg via INTRAVENOUS
  Filled 2015-11-23 (×3): qty 1

## 2015-11-23 MED ORDER — SCOPOLAMINE 1 MG/3DAYS TD PT72
1.0000 | MEDICATED_PATCH | TRANSDERMAL | Status: DC
  Administered 2015-11-23: 1 via TRANSDERMAL

## 2015-11-23 MED ORDER — FENTANYL CITRATE (PF) 100 MCG/2ML IJ SOLN
25.0000 ug | INTRAMUSCULAR | Status: DC | PRN
  Administered 2015-11-23: 50 ug via INTRAVENOUS

## 2015-11-23 MED ORDER — ONDANSETRON HCL 4 MG/2ML IJ SOLN
INTRAMUSCULAR | Status: AC
  Filled 2015-11-23: qty 2

## 2015-11-23 MED ORDER — FENTANYL CITRATE (PF) 100 MCG/2ML IJ SOLN
50.0000 ug | Freq: Once | INTRAMUSCULAR | Status: AC
  Administered 2015-11-23: 50 ug via INTRAVENOUS
  Filled 2015-11-23: qty 2

## 2015-11-23 MED ORDER — DEXAMETHASONE SODIUM PHOSPHATE 10 MG/ML IJ SOLN
INTRAMUSCULAR | Status: DC | PRN
  Administered 2015-11-23: 10 mg via INTRAVENOUS

## 2015-11-23 MED ORDER — MIDAZOLAM HCL 2 MG/2ML IJ SOLN
INTRAMUSCULAR | Status: AC
  Filled 2015-11-23: qty 2

## 2015-11-23 MED ORDER — PHENAZOPYRIDINE HCL 100 MG PO TABS: 100.0000 mg | ORAL_TABLET | Freq: Three times a day (TID) | ORAL | Status: DC | PRN

## 2015-11-23 MED ORDER — SCOPOLAMINE 1 MG/3DAYS TD PT72
MEDICATED_PATCH | TRANSDERMAL | Status: AC
  Filled 2015-11-23: qty 1

## 2015-11-23 MED ORDER — ONDANSETRON HCL 4 MG/2ML IJ SOLN
4.0000 mg | Freq: Once | INTRAMUSCULAR | Status: AC
Start: 2015-11-23 — End: 2015-11-23
  Administered 2015-11-23: 4 mg via INTRAVENOUS
  Filled 2015-11-23: qty 2

## 2015-11-23 MED ORDER — OXYCODONE-ACETAMINOPHEN 10-325 MG PO TABS: 1.0000 | ORAL_TABLET | ORAL | Status: DC | PRN

## 2015-11-23 MED ORDER — PROMETHAZINE HCL 25 MG/ML IJ SOLN
6.2500 mg | INTRAMUSCULAR | Status: DC | PRN
  Administered 2015-11-23: 6.25 mg via INTRAVENOUS

## 2015-11-23 MED ORDER — LIDOCAINE HCL (CARDIAC) 20 MG/ML IV SOLN
INTRAVENOUS | Status: DC | PRN
  Administered 2015-11-23: 100 mg via INTRAVENOUS

## 2015-11-23 MED ORDER — FENTANYL CITRATE (PF) 100 MCG/2ML IJ SOLN
INTRAMUSCULAR | Status: DC | PRN
  Administered 2015-11-23: 25 ug via INTRAVENOUS

## 2015-11-23 MED ORDER — DEXAMETHASONE SODIUM PHOSPHATE 10 MG/ML IJ SOLN
INTRAMUSCULAR | Status: AC
  Filled 2015-11-23: qty 1

## 2015-11-23 MED ORDER — LACTATED RINGERS IV SOLN
INTRAVENOUS | Status: DC | PRN
  Administered 2015-11-23: 18:00:00 via INTRAVENOUS

## 2015-11-23 SURGICAL SUPPLY — 27 items
BAG URO CATCHER STRL LF (MISCELLANEOUS) ×4 IMPLANT
BASKET DAKOTA 1.9FR 11X120 (BASKET) ×4 IMPLANT
BASKET LASER NITINOL 1.9FR (BASKET) IMPLANT
BSKT STON RTRVL 120 1.9FR (BASKET)
CATH FOLEY LATEX FREE 20FR (CATHETERS)
CATH FOLEY LF 20FR (CATHETERS) IMPLANT
CATH INTERMIT  6FR 70CM (CATHETERS) ×4 IMPLANT
CLOTH BEACON ORANGE TIMEOUT ST (SAFETY) ×4 IMPLANT
FIBER LASER FLEXIVA 1000 (UROLOGICAL SUPPLIES) IMPLANT
FIBER LASER FLEXIVA 365 (UROLOGICAL SUPPLIES) IMPLANT
FIBER LASER FLEXIVA 550 (UROLOGICAL SUPPLIES) IMPLANT
FIBER LASER TRAC TIP (UROLOGICAL SUPPLIES) IMPLANT
GLOVE BIO SURGEON STRL SZ8 (GLOVE) ×4 IMPLANT
GOWN STRL REUS W/TWL XL LVL3 (GOWN DISPOSABLE) ×4 IMPLANT
GUIDEWIRE ANG ZIPWIRE 038X150 (WIRE) ×4 IMPLANT
GUIDEWIRE STR DUAL SENSOR (WIRE) ×4 IMPLANT
IV NS 1000ML (IV SOLUTION) ×4
IV NS 1000ML BAXH (IV SOLUTION) ×2 IMPLANT
MANIFOLD NEPTUNE II (INSTRUMENTS) ×4 IMPLANT
PACK CYSTO (CUSTOM PROCEDURE TRAY) ×4 IMPLANT
SHEATH ACCESS URETERAL 54CM (SHEATH) ×4 IMPLANT
STENT CONTOUR 6FRX26X.038 (STENTS) IMPLANT
STENT URET 6FRX26 CONTOUR (STENTS) ×3 IMPLANT
SYR CONTROL 10ML LL (SYRINGE) ×4 IMPLANT
TUBE FEEDING 8FR 16IN STR KANG (MISCELLANEOUS) ×4 IMPLANT
TUBING CONNECTING 10 (TUBING) ×3 IMPLANT
TUBING CONNECTING 10' (TUBING) ×1

## 2015-11-23 NOTE — ED Notes (Addendum)
Per EMS-woke up this am with right sided flank pain-hematuria, emesis-history of kidney stones-30 mg of Toradol and 4 mg of Zofran given in route around 0930

## 2015-11-23 NOTE — Anesthesia Postprocedure Evaluation (Signed)
Anesthesia Post Note  Patient: Barbara Wu  Procedure(s) Performed: Procedure(s) (LRB): CYSTOSCOPY WITH RETROGRADE PYELOGRAM, STONE BASKET EXTRACTION,URETERAL STENT PLACEMENT (Right)  Patient location during evaluation: PACU Anesthesia Type: General Level of consciousness: awake and alert Pain management: pain level controlled Vital Signs Assessment: post-procedure vital signs reviewed and stable Respiratory status: spontaneous breathing, nonlabored ventilation, respiratory function stable and patient connected to nasal cannula oxygen Cardiovascular status: blood pressure returned to baseline and stable Postop Assessment: no signs of nausea or vomiting Anesthetic complications: no    Last Vitals:  Filed Vitals:   11/23/15 1958 11/23/15 2000  BP:  122/78  Pulse: 67 73  Temp:    Resp:      Last Pain:  Filed Vitals:   11/23/15 2040  PainSc: 2                  Cecile HearingStephen Edward Koal Eslinger

## 2015-11-23 NOTE — Op Note (Signed)
Preoperative diagnosis: Right ureteral stone  Postoperative diagnosis: Same  Procedure: 1 cystoscopy 2 right retrograde pyelography 3.  Intraoperative fluoroscopy, under one hour, with interpretation 4.  Right diagnostic ureteroscopy 5.  Right 6 x 26 JJ stent placement  Attending: Cleda MccreedyPatrick Mackenzie  Anesthesia: General  Estimated blood loss: None  Drains: Right 6 x 26 JJ ureteral stent with tether  Specimens: none  Antibiotics: ancef  Findings: no hydronephrosis no stone located in the kidney or ureter.  Indications: Patient is a 45 year old female with a history of ureteral stone and who has failed medical expulsive therapy.  After discussing treatment options, she decided proceed with right ureteroscopic stone manipulation.  Procedure her in detail: The patient was brought to the operating room and a brief timeout was done to ensure correct patient, correct procedure, correct site.  General anesthesia was administered patient was placed in dorsal lithotomy position.  Her genitalia was then prepped and draped in usual sterile fashion.  A rigid 22 French cystoscope was passed in the urethra and the bladder.  Bladder was inspected free masses or lesions.  the right ureteral orifices were in the normal orthotopic locations.  a 6 french ureteral catheter was then instilled into the right ureter orifice.  a gentle retrograde was obtained and findings noted above.  we then placed a zip wire through the ureteral catheter and advanced up to the renal pelvis.  we then removed the cystoscope and cannulated the right ureteral orifice with a semirigid ureteroscope.  We encountered no stone in the ureter. Once we reached the UPJ and sensor wire was advanced into the renal pelvis. Over the wire a flexible ureteroscope was advanced up to the renal pelvis. We then performed nephroscopy and were unable to identify a stone. we then placed a 6 x 26 double-j ureteral stent over the original zip wire. We then  removed the wire and good coil was noted in the the renal pelvis under fluoroscopy and the bladder under direct vision.    the bladder was then drained and this concluded the procedure which was well tolerated by patient.  Complications: None  Condition: Stable, extubated, transferred to PACU  Plan: Patient is to be discharged home as to follow-up in 2 weeks. She is to remove her stent by pulling the tether in 48 hours

## 2015-11-23 NOTE — Consult Note (Signed)
Urology Consult  Referring physician: Dr. Audie Pinto  Reason for referral: intractable pain   Chief Complaint: right flank pain  History of Present Illness: Barbara Wu is a 45yo with a hx of nephrolithiasis and IC who presented to the ER with a 5 hour history of severe sharp, intermittent, nonradiating right flank pain. He last stone event was 5 years ago and she required a stent at that time. She has associated nausea/vomiting. She has had multiple doses of dilaudid which did not ease the pain.  Ct scan shows a right 71m UPJ calculus with mild hydronephrosis. Normal WBC count and creatinine  Past Medical History  Diagnosis Date  . Interstitial cystitis   . RSD (reflex sympathetic dystrophy)   . PONV (postoperative nausea and vomiting)   . PONV (postoperative nausea and vomiting)   . Arthritis   . GERD (gastroesophageal reflux disease)   . Fibromyalgia   . History of kidney stones   . Diabetes mellitus without complication (HCC)     diet controlled  . Biliary dyskinesia    Past Surgical History  Procedure Laterality Date  . Abdominal hysterectomy    . Cesarean section    . Tonsillectomy    . Anterior cruciate ligament repair    . Carpal tunnel release Bilateral   . Interstim implant placement  Feb 2010    bladder, Medtronic, serial # [id number], model [id number] info # [id number] . Hip arthroscopy Left 10/28/2012    Procedure: ARTHROSCOPY HIP- left;  Surgeon: DNinetta Lights MD;  [place] [facility]  Service: Orthopedics;  Laterality: Left;  . Cholecystectomy N/A 01/25/2014    Procedure: LAPAROSCOPIC CHOLECYSTECTOMY;  Surgeon: DCoralie Keens MD;  Location: [facility];  Service: General;  Laterality: N/A;    Medications: I have reviewed the patient's current medications. Allergies:  Allergies  Allergen Reactions  . Demerol Hives  . Morphine And Related Hives  . Other Hives    Eggplant allergy  . Tape Other (See Comments)    Band-aid (leaves a burn spot on skin)  . Meperidine Other  (See Comments)    HIVES    Family History  Problem Relation Age of Onset  . Seizures    . Stroke     Social History:  reports that she has been smoking.  She does not have any smokeless tobacco history on file. She reports that she does not drink alcohol or use illicit drugs.  Review of Systems  Genitourinary: Positive for hematuria and flank pain.  All other systems reviewed and are negative.   Physical Exam:  Vital signs in last 24 hours: Temp:  [97.9 F (36.6 C)-98.4 F (36.9 C)] 98.4 F (36.9 C) (06/29 1358) Pulse Rate:  [57-72] 72 (06/29 1358) Resp:  [19-20] 19 (06/29 1358) BP: (126-141)/(69-77) 136/73 mmHg (06/29 1358) SpO2:  [92 %-100 %] 92 % (06/29 1358) Physical Exam  Constitutional: She is oriented to person, place, and time. She appears well-developed and well-nourished.  HENT:  Head: Normocephalic and atraumatic.  Eyes: EOM are normal. Pupils are equal, round, and reactive to light.  Neck: Normal range of motion. No thyromegaly present.  Cardiovascular: Normal rate and regular rhythm.   Respiratory: Effort normal. No respiratory distress.  GI: Soft. She exhibits no distension.  Musculoskeletal: Normal range of motion.  Neurological: She is alert and oriented to person, place, and time.  Skin: Skin is warm and dry.  Psychiatric: She has a normal mood and affect. Her behavior is normal. Judgment and thought content normal.  Laboratory Data:  Results for orders placed or performed during the hospital encounter of 11/23/15 (from the past 72 hour(s))  Urinalysis, Routine w reflex microscopic- may I&O cath if menses     Status: Abnormal   Collection Time: 11/23/15 10:45 AM  Result Value Ref Range   Color, Urine AMBER (A) YELLOW    Comment: BIOCHEMICALS MAY BE AFFECTED BY COLOR   APPearance CLOUDY (A) CLEAR   Specific Gravity, Urine 1.027 1.005 - 1.030   pH 5.5 5.0 - 8.0   Glucose, UA NEGATIVE NEGATIVE mg/dL   Hgb urine dipstick LARGE (A) NEGATIVE    Bilirubin Urine NEGATIVE NEGATIVE   Ketones, ur NEGATIVE NEGATIVE mg/dL   Protein, ur 30 (A) NEGATIVE mg/dL   Nitrite NEGATIVE NEGATIVE   Leukocytes, UA NEGATIVE NEGATIVE  Urine microscopic-add on     Status: Abnormal   Collection Time: 11/23/15 10:45 AM  Result Value Ref Range   Squamous Epithelial / LPF 0-5 (A) NONE SEEN   WBC, UA 0-5 0 - 5 WBC/hpf   RBC / HPF 6-30 0 - 5 RBC/hpf   Bacteria, UA FEW (A) NONE SEEN   Urine-Other MUCOUS PRESENT   Comprehensive metabolic panel     Status: Abnormal   Collection Time: 11/23/15 11:25 AM  Result Value Ref Range   Sodium 141 135 - 145 mmol/L   Potassium 4.2 3.5 - 5.1 mmol/L   Chloride 114 (H) 101 - 111 mmol/L   CO2 20 (L) 22 - 32 mmol/L   Glucose, Bld 132 (H) 65 - 99 mg/dL   BUN 18 6 - 20 mg/dL   Creatinine, Ser 0.83 0.44 - 1.00 mg/dL   Calcium 8.5 (L) 8.9 - 10.3 mg/dL   Total Protein 6.8 6.5 - 8.1 g/dL   Albumin 4.2 3.5 - 5.0 g/dL   AST 22 15 - 41 U/L   ALT 26 14 - 54 U/L   Alkaline Phosphatase 42 38 - 126 U/L   Total Bilirubin 0.4 0.3 - 1.2 mg/dL   GFR calc non Af Amer >60 >60 mL/min   GFR calc Af Amer >60 >60 mL/min    Comment: (NOTE) The eGFR has been calculated using the CKD EPI equation. This calculation has not been validated in all clinical situations. eGFR's persistently <60 mL/min signify possible Chronic Kidney Disease.    Anion gap 7 5 - 15  CBC with Differential/Platelet     Status: None   Collection Time: 11/23/15 11:25 AM  Result Value Ref Range   WBC 8.9 4.0 - 10.5 K/uL   RBC 4.37 3.87 - 5.11 MIL/uL   Hemoglobin 13.7 12.0 - 15.0 g/dL   HCT 39.8 36.0 - 46.0 %   MCV 91.1 78.0 - 100.0 fL   MCH 31.4 26.0 - 34.0 pg   MCHC 34.4 30.0 - 36.0 g/dL   RDW 13.0 11.5 - 15.5 %   Platelets 228 150 - 400 K/uL   Neutrophils Relative % 76 %   Neutro Abs 6.7 1.7 - 7.7 K/uL   Lymphocytes Relative 18 %   Lymphs Abs 1.6 0.7 - 4.0 K/uL   Monocytes Relative 6 %   Monocytes Absolute 0.5 0.1 - 1.0 K/uL   Eosinophils Relative 0 %    Eosinophils Absolute 0.0 0.0 - 0.7 K/uL   Basophils Relative 0 %   Basophils Absolute 0.0 0.0 - 0.1 K/uL   No results found for this or any previous visit (from the past 240 hour(s)). Creatinine:  Recent Labs  11/23/15 1125  CREATININE 0.83   Baseline Creatinine: 0.8  Impression/Assessment:  44yo with right ureteral calculus, intractable pain  Plan:  The risks/benefits/alternatives to right ureteroscopic stone extraction with stent was explained to the patient and she understands and wishes to proceed with surgery  Nicolette Bang 11/23/2015, 4:02 PM

## 2015-11-23 NOTE — Progress Notes (Signed)
md informed constant urine drainage, order received to remove stent-per DR. McKenzie

## 2015-11-23 NOTE — Anesthesia Procedure Notes (Signed)
Procedure Name: Intubation Date/Time: 11/23/2015 6:04 PM Performed by: Wynonia SoursWALKER, Linlee Cromie L Pre-anesthesia Checklist: Patient identified, Emergency Drugs available, Suction available, Patient being monitored and Timeout performed Patient Re-evaluated:Patient Re-evaluated prior to inductionOxygen Delivery Method: Circle system utilized Preoxygenation: Pre-oxygenation with 100% oxygen Intubation Type: IV induction, Cricoid Pressure applied and Rapid sequence Ventilation: Mask ventilation without difficulty Laryngoscope Size: Mac and 4 Grade View: Grade I Tube type: Oral Tube size: 7.0 mm Number of attempts: 1 Airway Equipment and Method: Stylet Placement Confirmation: ETT inserted through vocal cords under direct vision,  positive ETCO2,  CO2 detector and breath sounds checked- equal and bilateral Secured at: 20 cm Tube secured with: Tape Dental Injury: Teeth and Oropharynx as per pre-operative assessment

## 2015-11-23 NOTE — Discharge Instructions (Signed)
Kidney Stones °Kidney stones (urolithiasis) are deposits that form inside your kidneys. The intense pain is caused by the stone moving through the urinary tract. When the stone moves, the ureter goes into spasm around the stone. The stone is usually passed in the urine.  °CAUSES  °· A disorder that makes certain neck glands produce too much parathyroid hormone (primary hyperparathyroidism). °· A buildup of uric acid crystals, similar to gout in your joints. °· Narrowing (stricture) of the ureter. °· A kidney obstruction present at birth (congenital obstruction). °· Previous surgery on the kidney or ureters. °· Numerous kidney infections. °SYMPTOMS  °· Feeling sick to your stomach (nauseous). °· Throwing up (vomiting). °· Blood in the urine (hematuria). °· Pain that usually spreads (radiates) to the groin. °· Frequency or urgency of urination. °DIAGNOSIS  °· Taking a history and physical exam. °· Blood or urine tests. °· CT scan. °· Occasionally, an examination of the inside of the urinary bladder (cystoscopy) is performed. °TREATMENT  °· Observation. °· Increasing your fluid intake. °· Extracorporeal shock wave lithotripsy--This is a noninvasive procedure that uses shock waves to break up kidney stones. °· Surgery may be needed if you have severe pain or persistent obstruction. There are various surgical procedures. Most of the procedures are performed with the use of small instruments. Only small incisions are needed to accommodate these instruments, so recovery time is minimized. °The size, location, and chemical composition are all important variables that will determine the proper choice of action for you. Talk to your health care provider to better understand your situation so that you will minimize the risk of injury to yourself and your kidney.  °HOME CARE INSTRUCTIONS  °· Drink enough water and fluids to keep your urine clear or pale yellow. This will help you to pass the stone or stone fragments. °· Strain  all urine through the provided strainer. Keep all particulate matter and stones for your health care provider to see. The stone causing the pain may be as small as a grain of salt. It is very important to use the strainer each and every time you pass your urine. The collection of your stone will allow your health care provider to analyze it and verify that a stone has actually passed. The stone analysis will often identify what you can do to reduce the incidence of recurrences. °· Only take over-the-counter or prescription medicines for pain, discomfort, or fever as directed by your health care provider. °· Keep all follow-up visits as told by your health care provider. This is important. °· Get follow-up X-rays if required. The absence of pain does not always mean that the stone has passed. It may have only stopped moving. If the urine remains completely obstructed, it can cause loss of kidney function or even complete destruction of the kidney. It is your responsibility to make sure X-rays and follow-ups are completed. Ultrasounds of the kidney can show blockages and the status of the kidney. Ultrasounds are not associated with any radiation and can be performed easily in a matter of minutes. °· Make changes to your daily diet as told by your health care provider. You may be told to: °¨ Limit the amount of salt that you eat. °¨ Eat 5 or more servings of fruits and vegetables each day. °¨ Limit the amount of meat, poultry, fish, and eggs that you eat. °· Collect a 24-hour urine sample as told by your health care provider. You may need to collect another urine sample every 6-12   months. °SEEK MEDICAL CARE IF: °· You experience pain that is progressive and unresponsive to any pain medicine you have been prescribed. °SEEK IMMEDIATE MEDICAL CARE IF:  °· Pain cannot be controlled with the prescribed medicine. °· You have a fever or shaking chills. °· The severity or intensity of pain increases over 18 hours and is not  relieved by pain medicine. °· You develop a new onset of abdominal pain. °· You feel faint or pass out. °· You are unable to urinate. °  °This information is not intended to replace advice given to you by your health care provider. Make sure you discuss any questions you have with your health care provider. °  °Document Released: 05/13/2005 Document Revised: 02/01/2015 Document Reviewed: 10/14/2012 °Elsevier Interactive Patient Education ©2016 [employer] ° °

## 2015-11-23 NOTE — ED Notes (Signed)
Bed: WA23 Expected date:  Expected time:  Means of arrival:  Comments: EMS-forgot 

## 2015-11-23 NOTE — Anesthesia Preprocedure Evaluation (Addendum)
Anesthesia Evaluation  Patient identified by MRN, date of birth, ID band Patient awake    Reviewed: Allergy & Precautions, NPO status , Patient's Chart, lab work & pertinent test results  History of Anesthesia Complications (+) PONV and history of anesthetic complications  Airway Mallampati: II  TM Distance: >3 FB Neck ROM: Full    Dental  (+) Teeth Intact, Dental Advisory Given   Pulmonary Current Smoker,    Pulmonary exam normal breath sounds clear to auscultation       Cardiovascular Exercise Tolerance: Good negative cardio ROS Normal cardiovascular exam Rhythm:Regular Rate:Normal     Neuro/Psych Reflex sympathetic dystrophy  negative psych ROS   GI/Hepatic negative GI ROS, Neg liver ROS, GERD  ,  Endo/Other  diabetes (diet controlled), Well Controlled, Type 2Obesity   Renal/GU Nephrolithiasis  Bladder dysfunction (interstitial cystitis)      Musculoskeletal  (+) Arthritis , Fibromyalgia -  Abdominal   Peds  Hematology negative hematology ROS (+)   Anesthesia Other Findings Day of surgery medications reviewed with the patient.  Reproductive/Obstetrics                            Anesthesia Physical Anesthesia Plan  ASA: III  Anesthesia Plan: General   Post-op Pain Management:    Induction: Intravenous  Airway Management Planned: Oral ETT  Additional Equipment:   Intra-op Plan:   Post-operative Plan: Extubation in OR  Informed Consent: I have reviewed the patients History and Physical, chart, labs and discussed the procedure including the risks, benefits and alternatives for the proposed anesthesia with the patient or authorized representative who has indicated his/her understanding and acceptance.   Dental advisory given  Plan Discussed with: CRNA  Anesthesia Plan Comments: (Risks/benefits of general anesthesia discussed with patient including risk of damage to  teeth, lips, gum, and tongue, nausea/vomiting, allergic reactions to medications, and the possibility of heart attack, stroke and death.  All patient questions answered.  Patient wishes to proceed.)        Anesthesia Quick Evaluation

## 2015-11-23 NOTE — Transfer of Care (Signed)
Immediate Anesthesia Transfer of Care Note  Patient: Barbara EmeryRita S Wu  Procedure(s) Performed: Procedure(s): CYSTOSCOPY WITH RETROGRADE PYELOGRAM, STONE BASKET EXTRACTION,URETERAL STENT PLACEMENT (Right)  Patient Location: PACU  Anesthesia Type:General  Level of Consciousness:  sedated, patient cooperative and responds to stimulation  Airway & Oxygen Therapy:Patient Spontanous Breathing and Patient connected to face mask oxgen  Post-op Assessment:  Report given to PACU RN and Post -op Vital signs reviewed and stable  Post vital signs:  Reviewed and stable  Last Vitals:  Filed Vitals:   11/23/15 1358 11/23/15 1900  BP: 136/73 146/77  Pulse: 72 97  Temp: 36.9 C 36.4 C  Resp: 19 13    Complications: No apparent anesthesia complications

## 2015-11-23 NOTE — Progress Notes (Signed)
Husband at bedside at [id number]

## 2015-11-23 NOTE — ED Notes (Signed)
unsuccessful with labs Patient transported to CT

## 2015-11-23 NOTE — ED Provider Notes (Signed)
CSN: [id number]     Arrival date & time 11/23/15  1010 History   First MD Initiated Contact with Patient 11/23/15 1026     Chief Complaint  Patient presents with  . Flank Pain      The history is limited by the condition of the patient.   Patient awoke this morning had right-sided flank pain associated with blood in her urine and vomiting and severe pain.  Call EMS she was given toward all Zofran en route area no fever chills.  She had felt normal before going to bed.  Has history of kidney stones but never passed one. Past Medical History  Diagnosis Date  . Interstitial cystitis   . RSD (reflex sympathetic dystrophy)   . PONV (postoperative nausea and vomiting)   . PONV (postoperative nausea and vomiting)   . Arthritis   . GERD (gastroesophageal reflux disease)   . Fibromyalgia   . History of kidney stones   . Diabetes mellitus without complication (HCC)     diet controlled  . Biliary dyskinesia    Past Surgical History  Procedure Laterality Date  . Abdominal hysterectomy    . Cesarean section    . Tonsillectomy    . Anterior cruciate ligament repair    . Carpal tunnel release Bilateral   . Interstim implant placement  Feb 2010    bladder, Medtronic, serial # [id number], model [id number], info # [phone]  . Hip arthroscopy Left 10/28/2012    Procedure: ARTHROSCOPY HIP- left;  Surgeon: Loreta Ave, MD;  Location: [facility] OR;  Service: Orthopedics;  Laterality: Left;  . Cholecystectomy N/A 01/25/2014    Procedure: LAPAROSCOPIC CHOLECYSTECTOMY;  Surgeon: Abigail Miyamoto, MD;  Location: [facility] ORS;  Service: General;  Laterality: N/A;   Family History  Problem Relation Age of Onset  . Seizures    . Stroke     Social History  Substance Use Topics  . Smoking status: Current Every Day Smoker -- 0.50 packs/day for 27 years  . Smokeless tobacco: None  . Alcohol Use: No   OB History    No data available     Review of Systems  Unable to perform ROS: Acuity of condition       Allergies  Demerol; Morphine and related; Other; Tape; and Meperidine  Home Medications   Prior to Admission medications   Medication Sig Start Date End Date Taking? Authorizing Provider  amitriptyline (ELAVIL) 25 MG tablet Take 25 mg by mouth at bedtime.   Yes Historical Provider, MD  hydrOXYzine (ATARAX/VISTARIL) 25 MG tablet Take 25 mg by mouth at bedtime.   Yes Historical Provider, MD  oxyCODONE-acetaminophen (PERCOCET/ROXICET) 5-325 MG tablet Take 1-2 tablets by mouth every 4 (four) hours as needed for severe pain. 07/23/15  Yes Mady Gemma, PA-C  oxyCODONE-acetaminophen (PERCOCET) 10-325 MG tablet Take 1 tablet by mouth every 4 (four) hours as needed for pain. 11/23/15   Malen Gauze, MD  phenazopyridine (PYRIDIUM) 100 MG tablet Take 1 tablet (100 mg total) by mouth 3 (three) times daily as needed for pain. 11/23/15   Malen Gauze, MD  sulfamethoxazole-trimethoprim (BACTRIM DS,SEPTRA DS) 800-160 MG tablet Take 1 tablet by mouth 2 (two) times daily. 11/23/15   Malen Gauze, MD   BP 122/78 mmHg  Pulse 73  Temp(Src) 97.5 F (36.4 C) (Oral)  Resp 16  SpO2 96% Physical Exam  Constitutional: She is oriented to person, place, and time. She appears well-developed and well-nourished. She appears distressed.  HENT:  Head: Normocephalic and atraumatic.  Eyes: Pupils are equal, round, and reactive to light.  Neck: Normal range of motion.  Cardiovascular: Normal rate and intact distal pulses.   Pulmonary/Chest: No respiratory distress.  Patient has right-sided flank pain.  Not reproducible with percussion.  Abdominal: Normal appearance. She exhibits no distension. There is no tenderness. There is no rebound.  Musculoskeletal: Normal range of motion.  Neurological: She is alert and oriented to person, place, and time. No cranial nerve deficit.  Skin: Skin is warm and dry. No rash noted.  Nursing note and vitals reviewed.   ED Course  Procedures (including  critical care time) Medications  HYDROmorphone (DILAUDID) injection 1 mg (1 mg Intravenous Given 11/23/15 1043)  ondansetron (ZOFRAN) injection 4 mg (4 mg Intravenous Given 11/23/15 1043)  HYDROmorphone (DILAUDID) injection 2 mg (2 mg Intravenous Given 11/23/15 1645)  diphenhydrAMINE (BENADRYL) injection 12.5 mg (12.5 mg Intravenous Given 11/23/15 1306)  ondansetron (ZOFRAN) injection 4 mg (4 mg Intravenous Given 11/23/15 1306)  sodium chloride 0.9 % bolus 1,000 mL (1,000 mLs Intravenous New Bag/Given 11/23/15 1431)  fentaNYL (SUBLIMAZE) injection 50 mcg (50 mcg Intravenous Given 11/23/15 1431)  promethazine (PHENERGAN) injection 25 mg (25 mg Intravenous Given 11/23/15 1431)  ceFAZolin (ANCEF) IVPB 2g/100 mL premix ( Intravenous MAR Hold 11/23/15 1813)    Labs Review Labs Reviewed  URINALYSIS, ROUTINE W REFLEX MICROSCOPIC (NOT AT [facility]) - Abnormal; Notable for the following:    Color, Urine AMBER (*)    APPearance CLOUDY (*)    Hgb urine dipstick LARGE (*)    Protein, ur 30 (*)    All other components within normal limits  COMPREHENSIVE METABOLIC PANEL - Abnormal; Notable for the following:    Chloride 114 (*)    CO2 20 (*)    Glucose, Bld 132 (*)    Calcium 8.5 (*)    All other components within normal limits  URINE MICROSCOPIC-ADD ON - Abnormal; Notable for the following:    Squamous Epithelial / LPF 0-5 (*)    Bacteria, UA FEW (*)    All other components within normal limits  CBC WITH DIFFERENTIAL/PLATELET    Imaging Review Ct Renal Stone Study  11/23/2015  CLINICAL DATA:  Right sent flank pain, hematuria starting this morning EXAM: CT ABDOMEN AND PELVIS WITHOUT CONTRAST TECHNIQUE: Multidetector CT imaging of the abdomen and pelvis was performed following the standard protocol without IV contrast. COMPARISON:  07/23/2015 FINDINGS: Lower chest:  The lung bases are unremarkable. Hepatobiliary: Unenhanced liver shows no biliary ductal dilatation. Status postcholecystectomy. Pancreas:  Unenhanced pancreas is unremarkable. Spleen: Unenhanced spleen is unremarkable. Adrenals/Urinary Tract: No adrenal gland mass. There is a central cyst in upper pole of the left kidney measures 1.7 cm. Nonobstructive calcified calculus in midpole of the left kidney measures 2.2 mm. There is mild right hydronephrosis and proximal right hydroureter. Mild right perinephric stranding. Axial image 44 there is 3.5 mm calcified calculus in proximal right ureter at the level of L3 vertebral body. Bilateral distal ureter is unremarkable. No calcified calculi are noted within empty urinary bladder. Stomach/Bowel: There is no small bowel obstruction. Moderate stool noted within cecum. The terminal ileum is unremarkable. Normal appendix is noted in axial image 66. Some colonic stool noted within distal sigmoid colon. No distal colonic obstruction. No colitis or diverticulitis. Vascular/Lymphatic: No aortic aneurysm. No retroperitoneal or mesenteric adenopathy. Reproductive: The patient is status post hysterectomy. Other: A left sacral stimulator is noted. No ascites or free abdominal air. No inguinal adenopathy. Tiny  umbilical hernia containing fat without evidence acute complication. Musculoskeletal: No destructive bony lesions are noted. Mild degenerative changes lower thoracic spine. IMPRESSION: 1. There is mild right hydronephrosis and proximal right hydroureter. 2. Axial image 44 there is 3.5 mm partially obstructive calcified calculus in proximal right ureter at the level of L3 vertebral body. 3. No calcified calculi are noted within empty urinary bladder. Bilateral distal ureter is unremarkable. 4. No pericecal inflammation.  Normal appendix. 5. Status post hysterectomy. 6. Degenerative changes thoracolumbar spine. Electronically Signed   By: Natasha MeadLiviu  Pop M.D.   On: 11/23/2015 11:35    After several rounds of pain medicine she continued to have unremitting right flank pain and nausea vomiting.  I discussed the case  with neurology who came to emergency department and saw the patient.  Patient is going to be taken to the OR.  MDM   Final diagnoses:  Renal colic on right side        Nelva Nayobert Michaeljames Milnes, MD 11/24/15 [phone]

## 2015-11-26 ENCOUNTER — Encounter (HOSPITAL_COMMUNITY): Payer: Self-pay | Admitting: Urology

## 2016-04-11 ENCOUNTER — Emergency Department (HOSPITAL_COMMUNITY)
Admission: EM | Admit: 2016-04-11 | Discharge: 2016-04-11 | Disposition: A | Discharge status: Home / Self Care | Payer: Medicare Other | Attending: Emergency Medicine | Admitting: Emergency Medicine

## 2016-04-11 ENCOUNTER — Emergency Department (HOSPITAL_COMMUNITY): Payer: Medicare Other

## 2016-04-11 ENCOUNTER — Encounter (HOSPITAL_COMMUNITY): Payer: Self-pay | Admitting: Emergency Medicine

## 2016-04-11 DIAGNOSIS — R109 Unspecified abdominal pain: Secondary | ICD-10-CM

## 2016-04-11 DIAGNOSIS — F17200 Nicotine dependence, unspecified, uncomplicated: Secondary | ICD-10-CM | POA: Insufficient documentation

## 2016-04-11 DIAGNOSIS — Z79899 Other long term (current) drug therapy: Secondary | ICD-10-CM | POA: Diagnosis not present

## 2016-04-11 DIAGNOSIS — E119 Type 2 diabetes mellitus without complications: Secondary | ICD-10-CM | POA: Diagnosis not present

## 2016-04-11 LAB — CBC WITH DIFFERENTIAL/PLATELET
BASOS ABS: 0 10*3/uL (ref 0.0–0.1)
BASOS PCT: 0 %
EOS PCT: 2 %
Eosinophils Absolute: 0.1 10*3/uL (ref 0.0–0.7)
HCT: 41.3 % (ref 36.0–46.0)
Hemoglobin: 14.1 g/dL (ref 12.0–15.0)
Lymphocytes Relative: 46 %
Lymphs Abs: 3.3 10*3/uL (ref 0.7–4.0)
MCH: 31.3 pg (ref 26.0–34.0)
MCHC: 34.1 g/dL (ref 30.0–36.0)
MCV: 91.6 fL (ref 78.0–100.0)
MONO ABS: 0.5 10*3/uL (ref 0.1–1.0)
Monocytes Relative: 6 %
Neutro Abs: 3.4 10*3/uL (ref 1.7–7.7)
Neutrophils Relative %: 46 %
PLATELETS: 255 10*3/uL (ref 150–400)
RBC: 4.51 MIL/uL (ref 3.87–5.11)
RDW: 13 % (ref 11.5–15.5)
WBC: 7.3 10*3/uL (ref 4.0–10.5)

## 2016-04-11 LAB — URINE MICROSCOPIC-ADD ON
SQUAMOUS EPITHELIAL / LPF: NONE SEEN
WBC UA: NONE SEEN WBC/hpf (ref 0–5)

## 2016-04-11 LAB — BASIC METABOLIC PANEL
ANION GAP: 6 (ref 5–15)
BUN: 19 mg/dL (ref 6–20)
CALCIUM: 9.5 mg/dL (ref 8.9–10.3)
CO2: 26 mmol/L (ref 22–32)
Chloride: 108 mmol/L (ref 101–111)
Creatinine, Ser: 0.69 mg/dL (ref 0.44–1.00)
GLUCOSE: 115 mg/dL — AB (ref 65–99)
Potassium: 4.1 mmol/L (ref 3.5–5.1)
Sodium: 140 mmol/L (ref 135–145)

## 2016-04-11 LAB — URINALYSIS, ROUTINE W REFLEX MICROSCOPIC
Bilirubin Urine: NEGATIVE
Glucose, UA: >1000 mg/dL — AB
Ketones, ur: NEGATIVE mg/dL
Leukocytes, UA: NEGATIVE
NITRITE: NEGATIVE
Protein, ur: NEGATIVE mg/dL
SPECIFIC GRAVITY, URINE: 1.03 (ref 1.005–1.030)
pH: 5.5 (ref 5.0–8.0)

## 2016-04-11 MED ORDER — ONDANSETRON HCL 4 MG/2ML IJ SOLN
4.0000 mg | Freq: Once | INTRAMUSCULAR | Status: AC
  Administered 2016-04-11: 4 mg via INTRAVENOUS
  Filled 2016-04-11: qty 2

## 2016-04-11 MED ORDER — OXYCODONE-ACETAMINOPHEN 5-325 MG PO TABS: 1.0000 | ORAL_TABLET | ORAL | 0 refills | Status: DC | PRN

## 2016-04-11 MED ORDER — OXYCODONE-ACETAMINOPHEN 5-325 MG PO TABS
1.0000 | ORAL_TABLET | Freq: Once | ORAL | Status: AC
  Administered 2016-04-11: 1 via ORAL
  Filled 2016-04-11: qty 1

## 2016-04-11 MED ORDER — ONDANSETRON 4 MG PO TBDP: 4.0000 mg | ORAL_TABLET | Freq: Three times a day (TID) | ORAL | 0 refills | Status: DC | PRN

## 2016-04-11 MED ORDER — KETOROLAC TROMETHAMINE 30 MG/ML IJ SOLN
30.0000 mg | Freq: Once | INTRAMUSCULAR | Status: AC
  Administered 2016-04-11: 30 mg via INTRAVENOUS
  Filled 2016-04-11: qty 1

## 2016-04-11 NOTE — ED Notes (Signed)
Patient d/c'd self care.  F/u and medications reviewed.  Patient verbalized understanding. 

## 2016-04-11 NOTE — ED Triage Notes (Signed)
Per pt, states right flank pain since yesterday-states she has US done on Tuesday which showed no stone-history of IC and kidney stones

## 2016-04-11 NOTE — Discharge Instructions (Signed)
Take the prescribed medication as directed. Follow-up with your urologist if symptoms do not seem to be improving. May also follow-up with your primary care doctor. Return to the ED for new concerns.

## 2016-04-11 NOTE — ED Provider Notes (Signed)
[facility]-EMERGENCY DEPT Provider Note   CSN: [id number] Arrival date & time: 04/11/16  1614     History   Chief Complaint Chief Complaint  Patient presents with  . Flank Pain    HPI Barbara Wu is a 45 y.o. female.  The history is provided by the patient and medical records.  Flank Pain     45 year old female with history of arthritis, fibromyalgia, GERD, interstitial cystitis, kidneys, presenting to the ED for flank pain. Patient reports symptoms have been ongoing for the past week. States she saw her urologist on Tuesday for a routine check given her interstitial cystitis. States she had a renal ultrasound which she was told was normal. Her symptoms have persisted. She endorses a flank pain with some radiation to her right lower abdomen. She does report some nausea and vomiting. She has chronic dysuria, this is unchanged. She denies any frank hematuria. No fever or chills. She is [facility]rology [facility]. Prior abdominal surgeries include hysterectomy, C-section, and cholecystectomy. She's not had any medications today for her symptoms.    Past Medical History:  Diagnosis Date  . Arthritis   . Biliary dyskinesia   . Diabetes mellitus without complication (HCC)    diet controlled  . Fibromyalgia   . GERD (gastroesophageal reflux disease)   . History of kidney stones   . Interstitial cystitis   . PONV (postoperative nausea and vomiting)   . PONV (postoperative nausea and vomiting)   . RSD (reflex sympathetic dystrophy)     Patient Active Problem List   Diagnosis Date Noted  . Biliary dyskinesia 01/21/2014  . Interstitial cystitis   . RSD (reflex sympathetic dystrophy)   . PONV (postoperative nausea and vomiting)   . Diabetes mellitus without complication (HCC)   . Tear of left acetabular labrum   . Chronic left hip pain     Past Surgical History:  Procedure Laterality Date  . ABDOMINAL HYSTERECTOMY    . ANTERIOR CRUCIATE LIGAMENT REPAIR    . CARPAL TUNNEL  RELEASE Bilateral   . CESAREAN SECTION    . CHOLECYSTECTOMY N/A 01/25/2014   Procedure: LAPAROSCOPIC CHOLECYSTECTOMY;  Surgeon: Abigail Miyamotoouglas Blackman, MD;  Location: [facility];  Service: General;  Laterality: N/A;  . CYSTOSCOPY W/ URETERAL STENT PLACEMENT Right 11/23/2015   Procedure: CYSTOSCOPY WITH RETROGRADE PYELOGRAM, STONE BASKET EXTRACTION,URETERAL STENT PLACEMENT;  Surgeon: Malen GauzePatrick L McKenzie, MD;  Location: [facility] OR[facility];  Service: Urology;  Laterality: Right;  . HIP ARTHROSCOPY Left 10/28/2012   Procedure: ARTHROSCOPY HIP- left;  Surgeon: Loreta Aveaniel F Murphy, MD;  [facility] OR;  Service: Orthopedics;  Laterality: Left;  . [facility]  [facility]   bladder, Medtronic, serial # [id number], model [id number], info # [phone]  . TONSILLECTOMY      OB History    No data available       Home Medications    Prior to Admission medications   Medication Sig Start Date End Date Taking? Authorizing Provider  amitriptyline (ELAVIL) 25 MG tablet Take 25 mg by mouth at bedtime.    Historical Provider, MD  hydrOXYzine (ATARAX/VISTARIL) 25 MG tablet Take 25 mg by mouth at bedtime.    Historical Provider, MD  oxyCODONE-acetaminophen (PERCOCET) 10-325 MG tablet Take 1 tablet by mouth every 4 (four) hours as needed for pain. 11/23/15   Malen GauzePatrick L McKenzie, MD  oxyCODONE-acetaminophen (PERCOCET/ROXICET) 5-325 MG tablet Take 1-2 tablets by mouth every 4 (four) hours as needed for severe pain. 07/23/15   Mady GemmaElizabeth C Westfall, PA-C  phenazopyridine (PYRIDIUM) 100 MG tablet Take 1 tablet (100 mg total) by mouth 3 (three) times daily as needed for pain. 11/23/15   Malen Gauze, MD  sulfamethoxazole-trimethoprim (BACTRIM DS,SEPTRA DS) 800-160 MG tablet Take 1 tablet by mouth 2 (two) times daily. 11/23/15   Malen Gauze, MD    Family History Family History  Problem Relation Age of Onset  . Seizures    . Stroke      Social History Social History  Substance Use Topics  . Smoking status: Current  Every Day Smoker    Packs/day: 0.50    Years: 27.00  . Smokeless tobacco: Not on file  . Alcohol use No     Allergies   Demerol; Morphine and related; Other; Tape; and Meperidine   Review of Systems Review of Systems  Genitourinary: Positive for flank pain.  All other systems reviewed and are negative.    Physical Exam Updated Vital Signs BP (!) 199/111 (BP Location: Left Arm)   Pulse 81   Temp 98.4 F (36.9 C) (Oral)   Resp 17   SpO2 100%   Physical Exam  Constitutional: She is oriented to person, place, and time. She appears well-developed and well-nourished.  HENT:  Head: Normocephalic and atraumatic.  Mouth/Throat: Oropharynx is clear and moist.  Eyes: Conjunctivae and EOM are normal. Pupils are equal, round, and reactive to light.  Neck: Normal range of motion.  Cardiovascular: Normal rate, regular rhythm and normal heart sounds.   Pulmonary/Chest: Effort normal and breath sounds normal. No respiratory distress. She has no wheezes.  Abdominal: Soft. Bowel sounds are normal. There is no tenderness. There is CVA tenderness. There is no rebound.  Right CVA tenderness with radiation to RLQ  Musculoskeletal: Normal range of motion.  Neurological: She is alert and oriented to person, place, and time.  Skin: Skin is warm and dry.  Psychiatric: She has a normal mood and affect.  Nursing note and vitals reviewed.    ED Treatments / Results  Labs (all labs ordered are listed, but only abnormal results are displayed) Labs Reviewed  URINALYSIS, ROUTINE W REFLEX MICROSCOPIC (NOT AT [facility]) - Abnormal; Notable for the following:       Result Value   Glucose, UA >1000 (*)    Hgb urine dipstick TRACE (*)    All other components within normal limits  URINE MICROSCOPIC-ADD ON - Abnormal; Notable for the following:    Bacteria, UA FEW (*)    All other components within normal limits  URINE CULTURE  CBC WITH DIFFERENTIAL/PLATELET  BASIC METABOLIC PANEL    EKG  EKG  Interpretation None       Radiology Ct Renal Stone Study  Result Date: 04/11/2016 CLINICAL DATA:  Right flank pain, onset yesterday. EXAM: CT ABDOMEN AND PELVIS WITHOUT CONTRAST TECHNIQUE: Multidetector CT imaging of the abdomen and pelvis was performed following the standard protocol without IV contrast. COMPARISON:  CT 11/23/2015 FINDINGS: Lower chest: The lung bases are clear. Small paraesophageal lymph node is unchanged from prior exams. Hepatobiliary: Mild decreased hepatic density suggesting steatosis. No focal lesion is seen on noncontrast exam. Clips in the gallbladder fossa postcholecystectomy. No biliary dilatation. Pancreas: No ductal dilatation or inflammation. Spleen: Normal in size without focal abnormality. Adrenals/Urinary Tract: Possible punctate nonobstructing stone in the lower right kidney. No right hydronephrosis or obstructive uropathy. There is a nonobstructing 2 mm stone in the mid left kidney. No left hydronephrosis. Mild bilateral perinephric stranding is nonspecific. Cyst in the upper left kidney  is unchanged. No adrenal nodule. Urinary bladder is decompressed. Stomach/Bowel: Stomach is within normal limits. Appendix appears normal. No evidence of bowel wall thickening, distention, or inflammatory changes. Vascular/Lymphatic: Aortic atherosclerosis. No enlarged abdominal or pelvic lymph nodes. Reproductive: Status post hysterectomy. No adnexal masses. Other: Stimulator with tip in the presacral soft tissues, unchanged from prior. No free air, free fluid, or intra-abdominal fluid collection. Small fat containing umbilical hernia. Musculoskeletal: There are no acute or suspicious osseous abnormalities. IMPRESSION: Small left and possibly right nonobstructing renal calculi. No hydronephrosis or obstructive uropathy. No acute abnormality. Electronically Signed   By: Rubye OaksMelanie  Ehinger M.D.   On: 04/11/2016 20:59    Procedures Procedures (including critical care time)  Medications  Ordered in ED Medications  ketorolac (TORADOL) 30 MG/ML injection 30 mg (30 mg Intravenous Given 04/11/16 2000)  ondansetron (ZOFRAN) injection 4 mg (4 mg Intravenous Given 04/11/16 2000)  oxyCODONE-acetaminophen (PERCOCET/ROXICET) 5-325 MG per tablet 1 tablet (1 tablet Oral Given 04/11/16 2205)     Initial Impression / Assessment and Plan / ED Course  I have reviewed the triage vital signs and the nursing notes.  Pertinent labs & imaging results that were available during my care of the patient were reviewed by me and considered in my medical decision making (see chart for details).  Clinical Course    45 year old female here with right flank pain. Reports chronic dysuria secondary to interstitial cystitis. She is afebrile and nontoxic here. She does have some right CVA tenderness on exam radiation into her right lower quadrant. labwork is overall reassuring. ua with few bacteria, no leukocytes. urine culture was sent. CT renal study with small left and right nonobstructing renal calculi, no ureteral stones. Patient's pain has been controlled well here. She is tolerating oral fluids without difficulty. Will plan to discharge home with supportive care. She will follow-up with her urologist.  Discussed plan with patient, she acknowledged understanding and agreed with plan of care.  Return precautions given for new or worsening symptoms.  Final Clinical Impressions(s) / ED Diagnoses   Final diagnoses:  Flank pain    New Prescriptions Discharge Medication List as of 04/11/2016 10:22 PM    START taking these medications   Details  ondansetron (ZOFRAN ODT) 4 MG disintegrating tablet Take 1 tablet (4 mg total) by mouth every 8 (eight) hours as needed for nausea., Starting Thu 04/11/2016, Print         Garlon HatchetLisa M Laurrie Toppin, PA-C 04/11/16 2302    Tilden FossaElizabeth Rees, MD 04/12/16 [phone]

## 2016-04-13 LAB — URINE CULTURE

## 2017-03-26 ENCOUNTER — Encounter (HOSPITAL_COMMUNITY): Payer: Self-pay | Admitting: Emergency Medicine

## 2017-03-26 ENCOUNTER — Emergency Department (HOSPITAL_COMMUNITY): Payer: Medicare Other

## 2017-03-26 ENCOUNTER — Emergency Department (HOSPITAL_COMMUNITY)
Admission: EM | Admit: 2017-03-26 | Discharge: 2017-03-26 | Disposition: A | Discharge status: Home / Self Care | Payer: Medicare Other | Attending: Emergency Medicine | Admitting: Emergency Medicine

## 2017-03-26 DIAGNOSIS — E119 Type 2 diabetes mellitus without complications: Secondary | ICD-10-CM | POA: Insufficient documentation

## 2017-03-26 DIAGNOSIS — N200 Calculus of kidney: Secondary | ICD-10-CM | POA: Insufficient documentation

## 2017-03-26 DIAGNOSIS — Z79899 Other long term (current) drug therapy: Secondary | ICD-10-CM | POA: Insufficient documentation

## 2017-03-26 DIAGNOSIS — R1031 Right lower quadrant pain: Secondary | ICD-10-CM | POA: Diagnosis present

## 2017-03-26 DIAGNOSIS — R109 Unspecified abdominal pain: Secondary | ICD-10-CM

## 2017-03-26 DIAGNOSIS — F17200 Nicotine dependence, unspecified, uncomplicated: Secondary | ICD-10-CM | POA: Insufficient documentation

## 2017-03-26 LAB — BASIC METABOLIC PANEL
Anion gap: 9 (ref 5–15)
BUN: 18 mg/dL (ref 6–20)
CALCIUM: 9.6 mg/dL (ref 8.9–10.3)
CO2: 25 mmol/L (ref 22–32)
CREATININE: 0.76 mg/dL (ref 0.44–1.00)
Chloride: 106 mmol/L (ref 101–111)
GFR calc non Af Amer: >60 mL/min (ref >60)
Glucose, Bld: 107 mg/dL — ABNORMAL HIGH (ref 65–99)
POTASSIUM: 4.1 mmol/L (ref 3.5–5.1)
SODIUM: 140 mmol/L (ref 135–145)

## 2017-03-26 LAB — URINALYSIS, ROUTINE W REFLEX MICROSCOPIC
BILIRUBIN URINE: NEGATIVE
Glucose, UA: NEGATIVE mg/dL
Ketones, ur: 5 mg/dL — AB
Leukocytes, UA: NEGATIVE
Nitrite: NEGATIVE
PROTEIN: NEGATIVE mg/dL
SPECIFIC GRAVITY, URINE: 1.018 (ref 1.005–1.030)
pH: 5 (ref 5.0–8.0)

## 2017-03-26 LAB — CBC
HCT: 41.7 % (ref 36.0–46.0)
Hemoglobin: 14.4 g/dL (ref 12.0–15.0)
MCH: 31.5 pg (ref 26.0–34.0)
MCHC: 34.5 g/dL (ref 30.0–36.0)
MCV: 91.2 fL (ref 78.0–100.0)
PLATELETS: 263 10*3/uL (ref 150–400)
RBC: 4.57 MIL/uL (ref 3.87–5.11)
RDW: 12.8 % (ref 11.5–15.5)
WBC: 7.7 10*3/uL (ref 4.0–10.5)

## 2017-03-26 MED ORDER — HYDROMORPHONE HCL 1 MG/ML IJ SOLN
0.5000 mg | Freq: Once | INTRAMUSCULAR | Status: AC
  Administered 2017-03-26: 0.5 mg via INTRAVENOUS
  Filled 2017-03-26: qty 1

## 2017-03-26 MED ORDER — OXYCODONE-ACETAMINOPHEN 5-325 MG PO TABS: 1.0000 | ORAL_TABLET | Freq: Three times a day (TID) | ORAL | 0 refills | Status: DC | PRN

## 2017-03-26 MED ORDER — PHENAZOPYRIDINE HCL 200 MG PO TABS: 200.0000 mg | ORAL_TABLET | Freq: Three times a day (TID) | ORAL | 0 refills | Status: DC

## 2017-03-26 MED ORDER — KETOROLAC TROMETHAMINE 30 MG/ML IJ SOLN
30.0000 mg | Freq: Once | INTRAMUSCULAR | Status: AC
  Administered 2017-03-26: 30 mg via INTRAVENOUS
  Filled 2017-03-26: qty 1

## 2017-03-26 MED ORDER — SODIUM CHLORIDE 0.9 % IV BOLUS (SEPSIS)
1000.0000 mL | Freq: Once | INTRAVENOUS | Status: AC
  Administered 2017-03-26: 1000 mL via INTRAVENOUS

## 2017-03-26 MED ORDER — ONDANSETRON 4 MG PO TBDP: 4.0000 mg | ORAL_TABLET | Freq: Three times a day (TID) | ORAL | 0 refills | Status: DC | PRN

## 2017-03-26 NOTE — ED Provider Notes (Signed)
[facility]-EMERGENCY DEPT Provider Note   CSN: [id number] Arrival date & time: 03/26/17  1025     History   Chief Complaint Chief Complaint  Patient presents with  . Flank Pain    HPI Barbara Wu is a 46 y.o. female with a past medical history of interstitial cystitis, prior kidney stones, diabetes, who presents to ED for evaluation of 8-hour history of right-sided flank pain with associated urinary pressure and nausea/vomiting.  She states that the pain is rated at 10/10, described as sharp and does not radiate.  States that this feels similar to her prior kidney stones.  She also reports intermittent diarrhea for the past 2 days.  She denies fever, hematemesis, hematochezia, hematuria, vaginal complaints.  HPI  Past Medical History:  Diagnosis Date  . Arthritis   . Biliary dyskinesia   . Diabetes mellitus without complication (HCC)    diet controlled  . Fibromyalgia   . GERD (gastroesophageal reflux disease)   . History of kidney stones   . Interstitial cystitis   . PONV (postoperative nausea and vomiting)   . PONV (postoperative nausea and vomiting)   . RSD (reflex sympathetic dystrophy)     Patient Active Problem List   Diagnosis Date Noted  . Biliary dyskinesia 01/21/2014  . Interstitial cystitis   . RSD (reflex sympathetic dystrophy)   . PONV (postoperative nausea and vomiting)   . Diabetes mellitus without complication (HCC)   . Tear of left acetabular labrum   . Chronic left hip pain     Past Surgical History:  Procedure Laterality Date  . ABDOMINAL HYSTERECTOMY    . ANTERIOR CRUCIATE LIGAMENT REPAIR    . CARPAL TUNNEL RELEASE Bilateral   . CESAREAN SECTION    . CHOLECYSTECTOMY N/A 01/25/2014   Procedure: LAPAROSCOPIC CHOLECYSTECTOMY;  Surgeon: Abigail Miyamoto, MD;  Location: [facility];  Service: General;  Laterality: N/A;  . CYSTOSCOPY W/ URETERAL STENT PLACEMENT Right 11/23/2015   Procedure: CYSTOSCOPY WITH RETROGRADE PYELOGRAM,  STONE BASKET EXTRACTION,URETERAL STENT PLACEMENT;  Surgeon: Malen Gauze, MD;  Location: [facility];  Service: Urology;  Laterality: Right;  . HIP ARTHROSCOPY Left 10/28/2012   Procedure: ARTHROSCOPY HIP- left;  Surgeon: Loreta Ave, MD;  Location: [facility] OR;  Service: Orthopedics;  Laterality: Left;  . [facility] IMPLANT PLACEMENT  Feb 2010   bladder, Medtronic, serial # [id number], model [id number], info # [phone]  . TONSILLECTOMY      OB History    No data available       Home Medications    Prior to Admission medications   Medication Sig Start Date End Date Taking? Authorizing Provider  amitriptyline (ELAVIL) 25 MG tablet Take 25 mg by mouth at bedtime.   Yes [provider]  FLUoxetine (PROZAC) 20 MG capsule Take 20 mg by mouth daily.  02/11/17 02/11/18 Yes [provider]  hydrOXYzine (ATARAX/VISTARIL) 25 MG tablet Take 25 mg by mouth at bedtime.   Yes [provider]  metFORMIN (GLUCOPHAGE) 500 MG tablet Take 500 mg by mouth 2 (two) times daily with a meal.  02/11/17 02/11/18 Yes [provider]  Vitamin D, Ergocalciferol, (DRISDOL) 50000 units CAPS capsule Take 50,000 Units by mouth every 7 (seven) days.  11/11/16  Yes [provider]  ondansetron (ZOFRAN ODT) 4 MG disintegrating tablet Take 1 tablet (4 mg total) by mouth every 8 (eight) hours as needed for nausea or vomiting. 03/26/17   Eldoris Beiser, PA-C  oxyCODONE-acetaminophen (PERCOCET/ROXICET) 5-325 MG tablet Take  1 tablet by mouth every 8 (eight) hours as needed for severe pain. 03/26/17   Dewon Mendizabal, PA-C  phenazopyridine (PYRIDIUM) 200 MG tablet Take 1 tablet (200 mg total) by mouth 3 (three) times daily. 03/26/17   Madge Therrien, PA-C  polyethylene glycol (MIRALAX / GLYCOLAX) packet Take 17 g by mouth daily as needed for moderate constipation.    [provider]  sulfamethoxazole-trimethoprim (BACTRIM DS,SEPTRA DS) 800-160 MG tablet Take 1 tablet by mouth 2 (two) times  daily. Patient not taking: Reported on 04/11/2016 11/23/15   Malen GauzeMcKenzie, Patrick L, MD    Family History Family History  Problem Relation Age of Onset  . Seizures Unknown   . Stroke Unknown     Social History Social History  Substance Use Topics  . Smoking status: Current Every Day Smoker    Packs/day: 0.50    Years: 27.00  . Smokeless tobacco: Not on file  . Alcohol use No     Allergies   Demerol; Morphine and related; Other; Tape; and Meperidine   Review of Systems Review of Systems  Constitutional: Positive for appetite change. Negative for chills and fever.  HENT: Negative for ear pain, rhinorrhea, sneezing and sore throat.   Eyes: Negative for photophobia and visual disturbance.  Respiratory: Negative for cough, chest tightness, shortness of breath and wheezing.   Cardiovascular: Negative for chest pain and palpitations.  Gastrointestinal: Positive for abdominal pain, nausea and vomiting. Negative for blood in stool, constipation and diarrhea.  Genitourinary: Positive for decreased urine volume, flank pain and urgency. Negative for dysuria and hematuria.  Musculoskeletal: Negative for myalgias.  Skin: Negative for rash.  Neurological: Negative for dizziness, weakness and light-headedness.     Physical Exam Updated Vital Signs BP 124/77 (BP Location: Left Arm)   Pulse 61   Temp 98.4 F (36.9 C) (Oral)   Resp 16   SpO2 98%   Physical Exam  Constitutional: She appears well-developed and well-nourished. No distress.  HENT:  Head: Normocephalic and atraumatic.  Nose: Nose normal.  Eyes: Conjunctivae and EOM are normal. Left eye exhibits no discharge. No scleral icterus.  Neck: Normal range of motion. Neck supple.  Cardiovascular: Normal rate, regular rhythm, normal heart sounds and intact distal pulses.  Exam reveals no gallop and no friction rub.   No murmur heard. Pulmonary/Chest: Effort normal and breath sounds normal. No respiratory distress.  Abdominal:  Soft. Bowel sounds are normal. She exhibits no distension. There is no tenderness. There is no guarding.  Right-sided CVA tenderness and flank tenderness.  Musculoskeletal: Normal range of motion. She exhibits no edema.  Neurological: She is alert. She exhibits normal muscle tone. Coordination normal.  Skin: Skin is warm and dry. No rash noted.  Psychiatric: She has a normal mood and affect.  Nursing note and vitals reviewed.    ED Treatments / Results  Labs (all labs ordered are listed, but only abnormal results are displayed) Labs Reviewed  URINALYSIS, ROUTINE W REFLEX MICROSCOPIC - Abnormal; Notable for the following:       Result Value   APPearance HAZY (*)    Hgb urine dipstick MODERATE (*)    Ketones, ur 5 (*)    Bacteria, UA RARE (*)    Squamous Epithelial / LPF 0-5 (*)    All other components within normal limits  BASIC METABOLIC PANEL - Abnormal; Notable for the following:    Glucose, Bld 107 (*)    All other components within normal limits  URINE CULTURE  CBC  EKG  EKG Interpretation None       Radiology Ct Renal Stone Study  Result Date: 03/26/2017 CLINICAL DATA:  46 y/o  F; right flank pain. EXAM: CT ABDOMEN AND PELVIS WITHOUT CONTRAST TECHNIQUE: Multidetector CT imaging of the abdomen and pelvis was performed following the standard protocol without IV contrast. COMPARISON:  04/11/2016 CT abdomen and pelvis. FINDINGS: Lower chest: No acute abnormality. Hepatobiliary: Hepatic steatosis. Cholecystectomy. No biliary ductal dilatation. Pancreas: Unremarkable. No pancreatic ductal dilatation or surrounding inflammatory changes. Spleen: Normal in size without focal abnormality. Adrenals/Urinary Tract: Normal adrenal glands. 3 mm left kidney upper pole nephrolithiasis. No left hydronephrosis or ureter stone. 19 mm left kidney upper pole cyst. Asymmetric right kidney perinephric stranding. No right hydronephrosis. 2 mm nephrolithiasis within the right ureterovesicular  junction (series 4 image 101). Normal bladder. Stomach/Bowel: Stomach is within normal limits. Appendix appears normal. No evidence of bowel wall thickening, distention, or inflammatory changes. Vascular/Lymphatic: Aortic atherosclerosis. No enlarged abdominal or pelvic lymph nodes. Reproductive: Status post hysterectomy. No adnexal masses. Other: No abdominal wall hernia or abnormality. No abdominopelvic ascites. Musculoskeletal: No acute or significant osseous findings. IMPRESSION: 1. 2 mm stone within right ureterovesicular junction. No right hydronephrosis. Mildly asymmetric right-sided perinephric stranding. 2. 3 mm left kidney upper pole nonobstructive nephrolithiasis. 3. Hepatic steatosis. 4. Aortic atherosclerosis. Electronically Signed   By: Mitzi Hansen M.D.   On: 03/26/2017 19:15    Procedures Procedures (including critical care time)  Medications Ordered in ED Medications  ketorolac (TORADOL) 30 MG/ML injection 30 mg (30 mg Intravenous Given 03/26/17 1656)  sodium chloride 0.9 % bolus 1,000 mL (0 mLs Intravenous Stopped 03/26/17 1819)  HYDROmorphone (DILAUDID) injection 0.5 mg (0.5 mg Intravenous Given 03/26/17 2004)     Initial Impression / Assessment and Plan / ED Course  I have reviewed the triage vital signs and the nursing notes.  Pertinent labs & imaging results that were available during my care of the patient were reviewed by me and considered in my medical decision making (see chart for details).     Patient, with a past medical history of interstitial nephritis, prior nephrolithiasis, presents to ED for evaluation of acute onset R flank pain, nausea, vomiting, urinary pressure and urgency that is similar to her previous kidney stones. She has had several episodes of NBNB vomiting. She is afebrile with no history of fever and no use of antipyretics. CBC unremarkable with no leukocytosis. BMP shows normal renal function. Urinalysis with no evidence of UTI but TNTC  RBCs. CT renal stone study showed 2mm R UVJ stone with no hydronephrosis. Pain controlled here in the ED. Patient is followed by [facility]rologist at [facility]. Will discharge with pain medication, nausea medication and follow up with urologist.  Final Clinical Impressions(s) / ED Diagnoses   Final diagnoses:  Nephrolithiasis  Right flank pain    New Prescriptions New Prescriptions   ONDANSETRON (ZOFRAN ODT) 4 MG DISINTEGRATING TABLET    Take 1 tablet (4 mg total) by mouth every 8 (eight) hours as needed for nausea or vomiting.   OXYCODONE-ACETAMINOPHEN (PERCOCET/ROXICET) 5-325 MG TABLET    Take 1 tablet by mouth every 8 (eight) hours as needed for severe pain.   PHENAZOPYRIDINE (PYRIDIUM) 200 MG TABLET    Take 1 tablet (200 mg total) by mouth 3 (three) times daily.     Dietrich Pates, PA-C 03/26/17 2021    Shaune Pollack, MD 03/27/17 1149

## 2017-03-26 NOTE — ED Triage Notes (Signed)
Per pt, states rigtht flank pain with urinary frequency-started around 0800-states she has a history of kidney stones

## 2017-03-26 NOTE — Discharge Instructions (Signed)
Please read the attached information regarding your condition. Take Zofran, Percocet and Pyridium as needed for your symptoms. Follow-up with your urologist for further evaluation. Return to ED for worsening pain, fevers, increased vomiting, lightheadedness or loss of consciousness.

## 2017-03-28 LAB — URINE CULTURE

## 2017-07-15 ENCOUNTER — Emergency Department (HOSPITAL_COMMUNITY): Payer: Medicare Other

## 2017-07-15 ENCOUNTER — Encounter (HOSPITAL_COMMUNITY): Payer: Self-pay

## 2017-07-15 ENCOUNTER — Emergency Department (HOSPITAL_COMMUNITY)
Admission: EM | Admit: 2017-07-15 | Discharge: 2017-07-15 | Disposition: A | Discharge status: Home / Self Care | Payer: Medicare Other | Attending: Emergency Medicine | Admitting: Emergency Medicine

## 2017-07-15 ENCOUNTER — Other Ambulatory Visit: Payer: Self-pay

## 2017-07-15 ENCOUNTER — Emergency Department (HOSPITAL_COMMUNITY)
Admission: EM | Admit: 2017-07-15 | Discharge: 2017-07-15 | Disposition: A | Discharge status: Home / Self Care | Payer: Medicare Other | Source: Home / Self Care | Attending: Emergency Medicine | Admitting: Emergency Medicine

## 2017-07-15 DIAGNOSIS — F17210 Nicotine dependence, cigarettes, uncomplicated: Secondary | ICD-10-CM | POA: Insufficient documentation

## 2017-07-15 DIAGNOSIS — Z79899 Other long term (current) drug therapy: Secondary | ICD-10-CM | POA: Diagnosis not present

## 2017-07-15 DIAGNOSIS — R1032 Left lower quadrant pain: Secondary | ICD-10-CM | POA: Diagnosis present

## 2017-07-15 DIAGNOSIS — Z7984 Long term (current) use of oral hypoglycemic drugs: Secondary | ICD-10-CM | POA: Insufficient documentation

## 2017-07-15 DIAGNOSIS — E119 Type 2 diabetes mellitus without complications: Secondary | ICD-10-CM | POA: Diagnosis not present

## 2017-07-15 DIAGNOSIS — F17200 Nicotine dependence, unspecified, uncomplicated: Secondary | ICD-10-CM | POA: Insufficient documentation

## 2017-07-15 DIAGNOSIS — N23 Unspecified renal colic: Secondary | ICD-10-CM

## 2017-07-15 HISTORY — DX: Disorder of kidney and ureter, unspecified: N28.9

## 2017-07-15 LAB — URINALYSIS, MICROSCOPIC (REFLEX)

## 2017-07-15 LAB — URINALYSIS, ROUTINE W REFLEX MICROSCOPIC
GLUCOSE, UA: NEGATIVE mg/dL
LEUKOCYTES UA: NEGATIVE
Nitrite: NEGATIVE
PH: 5.5 (ref 5.0–8.0)
PROTEIN: 100 mg/dL — AB
Specific Gravity, Urine: >1.03 — ABNORMAL HIGH (ref 1.005–1.030)

## 2017-07-15 LAB — BASIC METABOLIC PANEL
ANION GAP: 13 (ref 5–15)
BUN: 19 mg/dL (ref 6–20)
CHLORIDE: 107 mmol/L (ref 101–111)
CO2: 23 mmol/L (ref 22–32)
Calcium: 10 mg/dL (ref 8.9–10.3)
Creatinine, Ser: 0.87 mg/dL (ref 0.44–1.00)
GFR calc Af Amer: >60 mL/min (ref >60)
GLUCOSE: 224 mg/dL — AB (ref 65–99)
POTASSIUM: 4.4 mmol/L (ref 3.5–5.1)
Sodium: 143 mmol/L (ref 135–145)

## 2017-07-15 LAB — CBC WITH DIFFERENTIAL/PLATELET
BASOS ABS: 0 10*3/uL (ref 0.0–0.1)
Basophils Relative: 0 %
EOS PCT: 0 %
Eosinophils Absolute: 0 10*3/uL (ref 0.0–0.7)
HEMATOCRIT: 40.7 % (ref 36.0–46.0)
Hemoglobin: 14.2 g/dL (ref 12.0–15.0)
LYMPHS ABS: 1.7 10*3/uL (ref 0.7–4.0)
LYMPHS PCT: 14 %
MCH: 31.8 pg (ref 26.0–34.0)
MCHC: 34.9 g/dL (ref 30.0–36.0)
MCV: 91.1 fL (ref 78.0–100.0)
MONO ABS: 0.5 10*3/uL (ref 0.1–1.0)
MONOS PCT: 4 %
NEUTROS ABS: 9.9 10*3/uL — AB (ref 1.7–7.7)
Neutrophils Relative %: 82 %
PLATELETS: 257 10*3/uL (ref 150–400)
RBC: 4.47 MIL/uL (ref 3.87–5.11)
RDW: 12.9 % (ref 11.5–15.5)
WBC: 12.1 10*3/uL — ABNORMAL HIGH (ref 4.0–10.5)

## 2017-07-15 LAB — PREGNANCY, URINE: Preg Test, Ur: NEGATIVE

## 2017-07-15 MED ORDER — ONDANSETRON HCL 4 MG PO TABS: 4.0000 mg | ORAL_TABLET | Freq: Four times a day (QID) | ORAL | 0 refills | Status: DC | PRN

## 2017-07-15 MED ORDER — KETOROLAC TROMETHAMINE 30 MG/ML IJ SOLN
30.0000 mg | Freq: Once | INTRAMUSCULAR | Status: AC
  Administered 2017-07-15: 30 mg via INTRAVENOUS
  Filled 2017-07-15: qty 1

## 2017-07-15 MED ORDER — HYDROMORPHONE HCL 1 MG/ML IJ SOLN
1.0000 mg | Freq: Once | INTRAMUSCULAR | Status: AC
  Administered 2017-07-15: 1 mg via INTRAVENOUS
  Filled 2017-07-15: qty 1

## 2017-07-15 MED ORDER — HYDROMORPHONE HCL 1 MG/ML IJ SOLN
0.5000 mg | Freq: Once | INTRAMUSCULAR | Status: AC
  Administered 2017-07-15: 0.5 mg via INTRAVENOUS
  Filled 2017-07-15: qty 1

## 2017-07-15 MED ORDER — SODIUM CHLORIDE 0.9 % IV BOLUS (SEPSIS)
1000.0000 mL | Freq: Once | INTRAVENOUS | Status: AC
  Administered 2017-07-15: 1000 mL via INTRAVENOUS

## 2017-07-15 MED ORDER — OXYCODONE-ACETAMINOPHEN 5-325 MG PO TABS: 1.0000 | ORAL_TABLET | Freq: Three times a day (TID) | ORAL | 0 refills | Status: DC | PRN

## 2017-07-15 MED ORDER — ONDANSETRON 4 MG PO TBDP
4.0000 mg | ORAL_TABLET | Freq: Once | ORAL | Status: AC
  Administered 2017-07-15: 4 mg via ORAL
  Filled 2017-07-15: qty 1

## 2017-07-15 MED ORDER — TAMSULOSIN HCL 0.4 MG PO CAPS: 0.4000 mg | ORAL_CAPSULE | Freq: Every day | ORAL | 0 refills | Status: DC

## 2017-07-15 MED ORDER — ONDANSETRON HCL 4 MG/2ML IJ SOLN
4.0000 mg | Freq: Once | INTRAMUSCULAR | Status: AC
  Administered 2017-07-15: 4 mg via INTRAVENOUS
  Filled 2017-07-15: qty 2

## 2017-07-15 MED ORDER — KETOROLAC TROMETHAMINE 60 MG/2ML IM SOLN
30.0000 mg | Freq: Once | INTRAMUSCULAR | Status: AC
  Administered 2017-07-15: 30 mg via INTRAMUSCULAR
  Filled 2017-07-15: qty 2

## 2017-07-15 NOTE — ED Provider Notes (Signed)
[facility]-EMERGENCY DEPT Provider Note  CSN: [id number] Arrival date & time: 07/15/17 1816  Chief Complaint(s) Flank Pain  HPI Barbara Wu is a 47 y.o. female with a history of renal stones who was seen earlier today with left-sided flank pain attributed to renal colic.  Patient was treated symptomatically at that time and sent home with Percocets.  She reports that her pain has returned and the Percocets did not provide any relief.  Pain is fluctuating in nature and described as a deep stabbing pain.  No alleviating or aggravating factors.  No other associated symptoms.  HPI  Past Medical History Past Medical History:  Diagnosis Date  . Arthritis   . Biliary dyskinesia   . Diabetes mellitus without complication (HCC)    diet controlled  . Fibromyalgia   . GERD (gastroesophageal reflux disease)   . History of kidney stones   . Interstitial cystitis   . PONV (postoperative nausea and vomiting)   . PONV (postoperative nausea and vomiting)   . Renal disorder   . RSD (reflex sympathetic dystrophy)    Patient Active Problem List   Diagnosis Date Noted  . Biliary dyskinesia 01/21/2014  . Interstitial cystitis   . RSD (reflex sympathetic dystrophy)   . PONV (postoperative nausea and vomiting)   . Diabetes mellitus without complication (HCC)   . Tear of left acetabular labrum   . Chronic left hip pain    Home Medication(s) Prior to Admission medications   Medication Sig Start Date End Date Taking? Authorizing Provider  amitriptyline (ELAVIL) 25 MG tablet Take 25 mg by mouth at bedtime.   Yes [provider]  FLUoxetine (PROZAC) 20 MG capsule Take 20 mg by mouth daily.  02/11/17 02/11/18 Yes [provider]  hydrOXYzine (ATARAX/VISTARIL) 25 MG tablet Take 25 mg by mouth at bedtime.   Yes [provider]  metFORMIN (GLUCOPHAGE) 500 MG tablet Take 500 mg by mouth 2 (two) times daily with a meal.  02/11/17 02/11/18 Yes [provider]  ondansetron (ZOFRAN) 4 MG tablet Take 1 tablet (4 mg total) by mouth every 6 (six) hours as needed for nausea. 07/15/17  Yes Dione Booze, MD  oxyCODONE-acetaminophen (PERCOCET/ROXICET) 5-325 MG tablet Take 1 tablet by mouth every 8 (eight) hours as needed for severe pain. 07/15/17  Yes Dione Booze, MD  tamsulosin (FLOMAX) 0.4 MG CAPS capsule Take 1 capsule (0.4 mg total) by mouth daily. 07/15/17  Yes Dione Booze, MD                                                                                                                                    Past Surgical History Past Surgical History:  Procedure Laterality Date  . ABDOMINAL HYSTERECTOMY    . ANTERIOR CRUCIATE LIGAMENT REPAIR    . CARPAL TUNNEL RELEASE Bilateral   . CESAREAN SECTION    . CHOLECYSTECTOMY N/A 01/25/2014   Procedure:  LAPAROSCOPIC CHOLECYSTECTOMY;  Surgeon: Abigail Miyamotoouglas Blackman, MD;  Location: [facility];  Service: General;  Laterality: N/A;  . CYSTOSCOPY W/ URETERAL STENT PLACEMENT Right 11/23/2015   Procedure: CYSTOSCOPY WITH RETROGRADE PYELOGRAM, STONE BASKET EXTRACTION,URETERAL STENT PLACEMENT;  Surgeon: Malen GauzePatrick L McKenzie, MD;  Location: [facility];  Service: Urology;  Laterality: Right;  . HIP ARTHROSCOPY Left 10/28/2012   Procedure: ARTHROSCOPY HIP- left;  Surgeon: Loreta Aveaniel F Murphy, MD;  [facility];  Service: Orthopedics;  Laterality: Left;  . [facility]   bladder, Medtronic, serial # [id number], model [id number], info # [id number]  . TONSILLECTOMY     Family History Family History  Problem Relation Age of Onset  . Seizures Unknown   . Stroke Unknown     Social History Social History   Tobacco Use  . Smoking status: Current Every Day Smoker    Packs/day: 0.50    Years: 27.00    Pack years: 13.50  . Smokeless tobacco: Never Used  Substance Use Topics  . Alcohol use: No  . Drug use: No   Allergies Demerol; Morphine and related; Other; Tape; and Meperidine  Review of  Systems Review of Systems All other systems are reviewed and are negative for acute change except as noted in the HPI  Physical Exam Vital Signs  I have reviewed the triage vital signs BP 134/73 (BP Location: Left Arm)   Pulse 75   Resp 16   Ht 5\' 2"  (1.575 m)   Wt 99.8 kg (220 lb)   SpO2 98%   BMI 40.24 kg/m   Physical Exam  Constitutional: She is oriented to person, place, and time. She appears well-developed and well-nourished. No distress.  In obvious discomfort  HENT:  Head: Normocephalic and atraumatic.  Nose: Nose normal.  Eyes: Conjunctivae and EOM are normal. Pupils are equal, round, and reactive to light. Right eye exhibits no discharge. Left eye exhibits no discharge. No scleral icterus.  Neck: Normal range of motion. Neck supple.  Cardiovascular: Normal rate and regular rhythm. Exam reveals no gallop and no friction rub.  No murmur heard. Pulmonary/Chest: Effort normal and breath sounds normal. No stridor. No respiratory distress. She has no rales.  Abdominal: Soft. She exhibits no distension. There is no tenderness.  Musculoskeletal: She exhibits no edema or tenderness.  Neurological: She is alert and oriented to person, place, and time.  Skin: Skin is warm and dry. No rash noted. She is not diaphoretic. No erythema.  Psychiatric: She has a normal mood and affect.  Vitals reviewed.   ED Results and Treatments Labs (all labs ordered are listed, but only abnormal results are displayed) Labs Reviewed - No data to display                                                                                                                       EKG  EKG Interpretation  Date/Time:    Ventricular Rate:    PR Interval:    QRS Duration:  QT Interval:    QTC Calculation:   R Axis:     Text Interpretation:        Radiology US Renal  Result Date: 07/15/2017 CLINICAL DATA:  Left flank pain. EXAM: RENAL / URINARY TRACT ULTRASOUND COMPLETE COMPARISON:  CT 03/25/2018  FINDINGS: Right Kidney: Length: 11.3 cm. Echogenicity within normal limits. No mass or hydronephrosis visualized. Left Kidney: Length: 10.7 cm. The kidney is poorly visualized due to body habitus and overlying rib shadowing. No definite hydronephrosis. Upper pole cyst on prior CT is not seen. Bladder: Only minimally distended. Appears normal for degree of bladder distention. IMPRESSION: 1. Limited sonographic evaluation of the left kidney without gross hydronephrosis. 2. No right hydronephrosis. Electronically Signed   By: Rubye Oaks M.D.   On: 07/15/2017 06:15   Pertinent labs & imaging results that were available during my care of the patient were reviewed by me and considered in my medical decision making (see chart for details).  Medications Ordered in ED Medications  ketorolac (TORADOL) injection 30 mg (30 mg Intramuscular Given 07/15/17 1951)  ondansetron (ZOFRAN-ODT) disintegrating tablet 4 mg (4 mg Oral Given 07/15/17 1955)  HYDROmorphone (DILAUDID) injection 1 mg (1 mg Intravenous Given 07/15/17 2108)                                                                                                                                    Procedures Procedures EMERGENCY DEPARTMENT US RENAL EXAM  "Study: Limited Retroperitoneal Ultrasound of Kidneys"  INDICATIONS: Flank pain Long and short axis of both kidneys were obtained.   PERFORMED BY: Myself IMAGES ARCHIVED?: Yes LIMITATIONS: Body habitus VIEWS USED: Long axis and Short axis  INTERPRETATION: Right Hydronephrosis absent, Left  Hydronephrosis mild hydroureter    (including critical care time)  Medical Decision Making / ED Course I have reviewed the nursing notes for this encounter and the patient's prior records (if available in EHR or on provided paperwork).    On review of records, patient's urine did not appear to be infected.  Repeat bedside ultrasound was performed to assess for development of possible hydronephrosis which  only showed mild hydroureter.  Pain was controlled with IM Toradol and IV Dilaudid.  Patient has follow-up appointment with urology in the morning. The patient is safe for discharge with strict return precautions.   Final Clinical Impression(s) / ED Diagnoses Final diagnoses:  Ureteral colic   Disposition: Discharge  Condition: Good  I have discussed the results, Dx and Tx plan with the patient who expressed understanding and agree(s) with the plan. Discharge instructions discussed at great length. The patient was given strict return precautions who verbalized understanding of the instructions. No further questions at time of discharge.    ED Discharge Orders    None       Follow Up: Marguerita Merles [address] [place] [address] [phone]  Schedule an appointment as soon as possible for  a visit  As needed  Urology   As scheduled      This chart was dictated using voice recognition software.  Despite best efforts to proofread,  errors can occur which can change the documentation meaning.   Nira Conn, MD 07/16/17 [phone]

## 2017-07-15 NOTE — Discharge Instructions (Signed)
Return if you start running a fever, pain is not being adequately controlled, or you are vomiting and unable to hold your medications down.

## 2017-07-15 NOTE — ED Triage Notes (Signed)
Patient reports that she was discharged from the ED at 0800 with a diagnosed of left kidney stone. Patient c/o pain medicine not working. (Percocet, zofran, and Flomax) patient states she is voiding small amounts

## 2017-07-15 NOTE — ED Triage Notes (Signed)
Pt woke up with left sided flank pain, hx of kidney stones, the pain feels the same, pt also has urinary retention

## 2017-07-15 NOTE — ED Provider Notes (Signed)
[facility]-EMERGENCY DEPT Provider Note   CSN: [id number] Arrival date & time: 07/15/17  0335     History   Chief Complaint Chief Complaint  Patient presents with  . Flank Pain    HPI Barbara Wu is a 47 y.o. female.  The history is provided by the patient.  She has a history of kidney stones, diabetes, reflex sympathetic dystrophy, fibromyalgia, GERD and comes in with sudden onset of left flank pain at about 1 AM.  This woke her up.  There is associated nausea and vomiting.  Pain is severe and she rates it at 9/10.  Nothing makes it better, nothing makes it worse.  She denies fever or chills.  She has been unable to urinate since pain started.  She has not been able to take anything for the pain because of vomiting.  Past Medical History:  Diagnosis Date  . Arthritis   . Biliary dyskinesia   . Diabetes mellitus without complication (HCC)    diet controlled  . Fibromyalgia   . GERD (gastroesophageal reflux disease)   . History of kidney stones   . Interstitial cystitis   . PONV (postoperative nausea and vomiting)   . PONV (postoperative nausea and vomiting)   . RSD (reflex sympathetic dystrophy)     Patient Active Problem List   Diagnosis Date Noted  . Biliary dyskinesia 01/21/2014  . Interstitial cystitis   . RSD (reflex sympathetic dystrophy)   . PONV (postoperative nausea and vomiting)   . Diabetes mellitus without complication (HCC)   . Tear of left acetabular labrum   . Chronic left hip pain     Past Surgical History:  Procedure Laterality Date  . ABDOMINAL HYSTERECTOMY    . ANTERIOR CRUCIATE LIGAMENT REPAIR    . CARPAL TUNNEL RELEASE Bilateral   . CESAREAN SECTION    . CHOLECYSTECTOMY N/A 01/25/2014   Procedure: LAPAROSCOPIC CHOLECYSTECTOMY;  Surgeon: Abigail Miyamoto, MD;  Location: [facility];  Service: General;  Laterality: N/A;  . CYSTOSCOPY W/ URETERAL STENT PLACEMENT Right 11/23/2015   Procedure: CYSTOSCOPY WITH RETROGRADE  PYELOGRAM, STONE BASKET EXTRACTION,URETERAL STENT PLACEMENT;  Surgeon: Malen Gauze, MD;  Location: [facility];  Service: Urology;  Laterality: Right;  . HIP ARTHROSCOPY Left 10/28/2012   Procedure: ARTHROSCOPY HIP- left;  Surgeon: Loreta Ave, MD;  Location: [facility] OR;  Service: Orthopedics;  Laterality: Left;  . [facility] IMPLANT PLACEMENT  Feb 2010   bladder, Medtronic, serial # [id number], model [id number], info # [phone]  . TONSILLECTOMY      OB History    No data available       Home Medications    Prior to Admission medications   Medication Sig Start Date End Date Taking? Authorizing Provider  amitriptyline (ELAVIL) 25 MG tablet Take 25 mg by mouth at bedtime.    [provider]  FLUoxetine (PROZAC) 20 MG capsule Take 20 mg by mouth daily.  02/11/17 02/11/18  [provider]  hydrOXYzine (ATARAX/VISTARIL) 25 MG tablet Take 25 mg by mouth at bedtime.    [provider]  metFORMIN (GLUCOPHAGE) 500 MG tablet Take 500 mg by mouth 2 (two) times daily with a meal.  02/11/17 02/11/18  [provider]  ondansetron (ZOFRAN ODT) 4 MG disintegrating tablet Take 1 tablet (4 mg total) by mouth every 8 (eight) hours as needed for nausea or vomiting. 03/26/17   Khatri, Hina, PA-C  oxyCODONE-acetaminophen (PERCOCET/ROXICET) 5-325 MG tablet Take 1 tablet by mouth every 8 (eight) hours  as needed for severe pain. 03/26/17   Khatri, Hina, PA-C  phenazopyridine (PYRIDIUM) 200 MG tablet Take 1 tablet (200 mg total) by mouth 3 (three) times daily. 03/26/17   Khatri, Hina, PA-C  polyethylene glycol (MIRALAX / GLYCOLAX) packet Take 17 g by mouth daily as needed for moderate constipation.    [provider]  sulfamethoxazole-trimethoprim (BACTRIM DS,SEPTRA DS) 800-160 MG tablet Take 1 tablet by mouth 2 (two) times daily. Patient not taking: Reported on 04/11/2016 11/23/15   Malen Gauze, MD  Vitamin D, Ergocalciferol, (DRISDOL) 50000 units CAPS capsule Take  50,000 Units by mouth every 7 (seven) days.  11/11/16   [provider]    Family History Family History  Problem Relation Age of Onset  . Seizures Unknown   . Stroke Unknown     Social History Social History   Tobacco Use  . Smoking status: Current Every Day Smoker    Packs/day: 0.50    Years: 27.00    Pack years: 13.50  . Smokeless tobacco: Never Used  Substance Use Topics  . Alcohol use: No  . Drug use: No     Allergies   Demerol; Morphine and related; Other; Tape; and Meperidine   Review of Systems Review of Systems  All other systems reviewed and are negative.    Physical Exam Updated Vital Signs BP (!) 152/105 (BP Location: Left Arm)   Pulse 72   Temp 98.4 F (36.9 C) (Oral)   Resp 20   Ht 5\' 2"  (1.575 m)   Wt 99.8 kg (220 lb)   SpO2 100%   BMI 40.24 kg/m   Physical Exam  Nursing note and vitals reviewed.  47 year old female, appears uncomfortable and appears to be in pain, but is in no acute distress. Vital signs are significant for elevated blood pressure. Oxygen saturation is 100%, which is normal. Head is normocephalic and atraumatic. PERRLA, EOMI. Oropharynx is clear. Neck is nontender and supple without adenopathy or JVD. Back is nontender and there is no CVA tenderness. Lungs are clear without rales, wheezes, or rhonchi. Chest is nontender. Heart has regular rate and rhythm without murmur. Abdomen is soft, flat, nontender without masses or hepatosplenomegaly and peristalsis is hypoactive. Extremities have no cyanosis or edema, full range of motion is present. Skin is warm and dry without rash. Neurologic: Mental status is normal, cranial nerves are intact, there are no motor or sensory deficits.  ED Treatments / Results  Labs (all labs ordered are listed, but only abnormal results are displayed) Labs Reviewed  URINALYSIS, ROUTINE W REFLEX MICROSCOPIC - Abnormal; Notable for the following components:      Result Value    APPearance TURBID (*)    Specific Gravity, Urine >1.030 (*)    Hgb urine dipstick LARGE (*)    Bilirubin Urine SMALL (*)    Ketones, ur TRACE (*)    Protein, ur 100 (*)    All other components within normal limits  BASIC METABOLIC PANEL - Abnormal; Notable for the following components:   Glucose, Bld 224 (*)    All other components within normal limits  CBC WITH DIFFERENTIAL/PLATELET - Abnormal; Notable for the following components:   WBC 12.1 (*)    Neutro Abs 9.9 (*)    All other components within normal limits  URINALYSIS, MICROSCOPIC (REFLEX) - Abnormal; Notable for the following components:   Bacteria, UA MANY (*)    Squamous Epithelial / LPF 0-5 (*)    All other components within  normal limits  PREGNANCY, URINE    Radiology [place] Renal  Result Date: 07/15/2017 CLINICAL DATA:  Left flank pain. EXAM: RENAL / URINARY TRACT ULTRASOUND COMPLETE COMPARISON:  CT 03/25/2018 FINDINGS: Right Kidney: Length: 11.3 cm. Echogenicity within normal limits. No mass or hydronephrosis visualized. Left Kidney: Length: 10.7 cm. The kidney is poorly visualized due to body habitus and overlying rib shadowing. No definite hydronephrosis. Upper pole cyst on prior CT is not seen. Bladder: Only minimally distended. Appears normal for degree of bladder distention. IMPRESSION: 1. Limited sonographic evaluation of the left kidney without gross hydronephrosis. 2. No right hydronephrosis. Electronically Signed   By: Rubye OaksMelanie  Ehinger M.D.   On: 07/15/2017 06:15    Procedures Procedures (including critical care time)  Medications Ordered in ED Medications  sodium chloride 0.9 % bolus 1,000 mL (1,000 mLs Intravenous New Bag/Given 07/15/17 0546)  ketorolac (TORADOL) 30 MG/ML injection 30 mg (30 mg Intravenous Given 07/15/17 0546)  ondansetron (ZOFRAN) injection 4 mg (4 mg Intravenous Given 07/15/17 0546)  HYDROmorphone (DILAUDID) injection 0.5 mg (0.5 mg Intravenous Given 07/15/17 0546)     Initial Impression /  Assessment and Plan / ED Course  I have reviewed the triage vital signs and the nursing notes.  Pertinent labs & imaging results that were available during my care of the patient were reviewed by me and considered in my medical decision making (see chart for details).  Left flank pain strongly suggestive of biliary colic.  Old records are reviewed, and she had a renal stone protocol CT scan on March 26, 2017 which did show a 3.7 mm left renal calculus.  This was not easily visible on topogram, so I do not feel KUB will be helpful.  She did not have any aneurysm identified, so aortic dissection is very unlikely.  She has 12 CT scans of abdomen and pelvis on record.  I do not see an indication for repeat CT scan.  She will be sent for renal ultrasound.  In the meantime, she is given IV fluids, ketorolac, hydromorphone, and ondansetron.   Ultrasound did not show clear evidence of hydronephrosis or calculus, but was a technically challenged study.  Urinalysis is consistent with urolithiasis.  She had considerable relief of pain with above-noted treatment, and states pain is down to 4/10.  She states she would feel comfortable going home at this point.  She is discharged with prescriptions for oxycodone-acetaminophen, ondansetron, and tamsulosin.  Recommended follow-up with her urologist.  Return precautions discussed.  Final Clinical Impressions(s) / ED Diagnoses   Final diagnoses:  Ureteral colic    ED Discharge Orders        Ordered    oxyCODONE-acetaminophen (PERCOCET/ROXICET) 5-325 MG tablet  Every 8 hours PRN     07/15/17 0705    ondansetron (ZOFRAN) 4 MG tablet  Every 6 hours PRN     07/15/17 0705    tamsulosin (FLOMAX) 0.4 MG CAPS capsule  Daily     07/15/17 0705       Dione BoozeGlick, Monee Dembeck, MD 07/15/17 [phone]

## 2017-07-15 NOTE — ED Notes (Signed)
EDP at bedside  

## 2017-07-18 ENCOUNTER — Encounter (HOSPITAL_COMMUNITY): Payer: Self-pay | Admitting: Emergency Medicine

## 2017-07-18 ENCOUNTER — Emergency Department (HOSPITAL_COMMUNITY)
Admission: EM | Admit: 2017-07-18 | Discharge: 2017-07-18 | Disposition: A | Discharge status: Home / Self Care | Payer: Medicare Other | Attending: Emergency Medicine | Admitting: Emergency Medicine

## 2017-07-18 ENCOUNTER — Other Ambulatory Visit: Payer: Self-pay

## 2017-07-18 ENCOUNTER — Emergency Department (HOSPITAL_COMMUNITY): Payer: Medicare Other

## 2017-07-18 DIAGNOSIS — E119 Type 2 diabetes mellitus without complications: Secondary | ICD-10-CM | POA: Insufficient documentation

## 2017-07-18 DIAGNOSIS — F17210 Nicotine dependence, cigarettes, uncomplicated: Secondary | ICD-10-CM | POA: Diagnosis not present

## 2017-07-18 DIAGNOSIS — N23 Unspecified renal colic: Secondary | ICD-10-CM | POA: Diagnosis not present

## 2017-07-18 DIAGNOSIS — Z79899 Other long term (current) drug therapy: Secondary | ICD-10-CM | POA: Insufficient documentation

## 2017-07-18 DIAGNOSIS — Z7984 Long term (current) use of oral hypoglycemic drugs: Secondary | ICD-10-CM | POA: Diagnosis not present

## 2017-07-18 DIAGNOSIS — R1032 Left lower quadrant pain: Secondary | ICD-10-CM | POA: Diagnosis present

## 2017-07-18 LAB — URINALYSIS, ROUTINE W REFLEX MICROSCOPIC
BILIRUBIN URINE: NEGATIVE
GLUCOSE, UA: NEGATIVE mg/dL
Ketones, ur: 5 mg/dL — AB
Leukocytes, UA: NEGATIVE
NITRITE: NEGATIVE
Protein, ur: NEGATIVE mg/dL
SPECIFIC GRAVITY, URINE: 1.015 (ref 1.005–1.030)
pH: 6 (ref 5.0–8.0)

## 2017-07-18 LAB — BASIC METABOLIC PANEL
Anion gap: 10 (ref 5–15)
BUN: 18 mg/dL (ref 6–20)
CHLORIDE: 108 mmol/L (ref 101–111)
CO2: 24 mmol/L (ref 22–32)
CREATININE: 1 mg/dL (ref 0.44–1.00)
Calcium: 8.9 mg/dL (ref 8.9–10.3)
GFR calc Af Amer: >60 mL/min (ref >60)
GFR calc non Af Amer: >60 mL/min (ref >60)
GLUCOSE: 150 mg/dL — AB (ref 65–99)
POTASSIUM: 4.4 mmol/L (ref 3.5–5.1)
Sodium: 142 mmol/L (ref 135–145)

## 2017-07-18 LAB — CBC
HEMATOCRIT: 37.3 % (ref 36.0–46.0)
Hemoglobin: 12.9 g/dL (ref 12.0–15.0)
MCH: 31.7 pg (ref 26.0–34.0)
MCHC: 34.6 g/dL (ref 30.0–36.0)
MCV: 91.6 fL (ref 78.0–100.0)
PLATELETS: 237 10*3/uL (ref 150–400)
RBC: 4.07 MIL/uL (ref 3.87–5.11)
RDW: 12.8 % (ref 11.5–15.5)
WBC: 7.1 10*3/uL (ref 4.0–10.5)

## 2017-07-18 MED ORDER — IOPAMIDOL (ISOVUE-300) INJECTION 61%
INTRAVENOUS | Status: AC
  Administered 2017-07-18: 100 mL
  Filled 2017-07-18: qty 100

## 2017-07-18 MED ORDER — HYDROMORPHONE HCL 1 MG/ML IJ SOLN
0.5000 mg | Freq: Once | INTRAMUSCULAR | Status: AC
  Administered 2017-07-18: 0.5 mg via INTRAVENOUS
  Filled 2017-07-18: qty 1

## 2017-07-18 MED ORDER — OXYCODONE-ACETAMINOPHEN 5-325 MG PO TABS: 1.0000 | ORAL_TABLET | ORAL | 0 refills | Status: DC | PRN

## 2017-07-18 MED ORDER — ONDANSETRON HCL 4 MG/2ML IJ SOLN
4.0000 mg | Freq: Once | INTRAMUSCULAR | Status: AC
  Administered 2017-07-18: 4 mg via INTRAVENOUS
  Filled 2017-07-18: qty 2

## 2017-07-18 NOTE — ED Notes (Signed)
Patient transported to CT 

## 2017-07-18 NOTE — ED Provider Notes (Addendum)
[facility]-EMERGENCY DEPT Provider Note   CSN: [id number] Arrival date & time: 07/18/17  1125     History   Chief Complaint Chief Complaint  Patient presents with  . Flank Pain    HPI   Blood pressure (!) 130/98, pulse 63, temperature 98.3 F (36.8 C), temperature source Oral, resp. rate 20, SpO2 100 %.  Barbara Wu is a 47 y.o. female complaining of stent left lower quadrant pain onset 3 days ago.  Patient has interstitial cystitis and has chronic dysuria and hematuria.  She states that the pain is severe, 8 out of 10, no exacerbating or alleviating factors identified.  She states that it is colicky in nature and when it is severe it radiates up to the left flank.  She states it feels similar to prior kidney stone exacerbations.  She denies any diarrhea, melena, hematochezia, fever, chills, nausea, vomiting.  She is taken Percocet and ibuprofen at home with little relief.  PCP: [facility]: [facility].  Past Medical History:  Diagnosis Date  . Arthritis   . Biliary dyskinesia   . Diabetes mellitus without complication (HCC)    diet controlled  . Fibromyalgia   . GERD (gastroesophageal reflux disease)   . History of kidney stones   . Interstitial cystitis   . PONV (postoperative nausea and vomiting)   . PONV (postoperative nausea and vomiting)   . Renal disorder   . RSD (reflex sympathetic dystrophy)     Patient Active Problem List   Diagnosis Date Noted  . Biliary dyskinesia 01/21/2014  . Interstitial cystitis   . RSD (reflex sympathetic dystrophy)   . PONV (postoperative nausea and vomiting)   . Diabetes mellitus without complication (HCC)   . Tear of left acetabular labrum   . Chronic left hip pain     Past Surgical History:  Procedure Laterality Date  . ABDOMINAL HYSTERECTOMY    . ANTERIOR CRUCIATE LIGAMENT REPAIR    . CARPAL TUNNEL RELEASE Bilateral   . CESAREAN SECTION    . CHOLECYSTECTOMY N/A 01/25/2014   Procedure: LAPAROSCOPIC CHOLECYSTECTOMY;  Surgeon: Abigail Miyamotoouglas Blackman, MD;  Location: [facility];  Service: General;  Laterality: N/A;  . CYSTOSCOPY W/ URETERAL STENT PLACEMENT Right 11/23/2015   Procedure: CYSTOSCOPY WITH RETROGRADE PYELOGRAM, STONE BASKET EXTRACTION,URETERAL STENT PLACEMENT;  Surgeon: Malen GauzePatrick L McKenzie, MD;  Location: [facility];  Service: Urology;  Laterality: Right;  . HIP ARTHROSCOPY Left 10/28/2012   Procedure: ARTHROSCOPY HIP- left;  Surgeon: Loreta Aveaniel F Murphy, MD;  [facility];  Service: Orthopedics;  Laterality: Left;  [facility]   bladder, Medtronic, serial # [id number], model [id number], info # [phone]  . TONSILLECTOMY      OB History    No data available       Home Medications    Prior to Admission medications   Medication Sig Start Date End Date Taking? Authorizing Provider  amitriptyline (ELAVIL) 25 MG tablet Take 25 mg by mouth at bedtime.   Yes [provider]  docusate sodium (PHILLIPS STOOL SOFTENER) 100 MG capsule Take 100 mg by mouth daily as needed for mild constipation.   Yes [provider]  FLUoxetine (PROZAC) 20 MG capsule Take 20 mg by mouth daily.  02/11/17 02/11/18 Yes [provider]  hydrOXYzine (ATARAX/VISTARIL) 25 MG tablet Take 25 mg by mouth at bedtime.   Yes [provider]  ibuprofen (ADVIL,MOTRIN) 200 MG tablet Take 200 mg by mouth every 6 (six)  hours as needed for moderate pain.   Yes [provider]  metFORMIN (GLUCOPHAGE) 500 MG tablet Take 500 mg by mouth 2 (two) times daily with a meal.  02/11/17 02/11/18 Yes [provider]  ondansetron (ZOFRAN) 4 MG tablet Take 1 tablet (4 mg total) by mouth every 6 (six) hours as needed for nausea. 07/15/17  Yes Dione Booze, MD  tamsulosin (FLOMAX) 0.4 MG CAPS capsule Take 1 capsule (0.4 mg total) by mouth daily. 07/15/17  Yes Dione Booze, MD  oxyCODONE-acetaminophen (PERCOCET) 5-325 MG tablet Take 1 tablet by mouth every 4  (four) hours as needed. 07/18/17   Tamyrah Burbage, Joni Reining, PA-C    Family History Family History  Problem Relation Age of Onset  . Seizures Unknown   . Stroke Unknown     Social History Social History   Tobacco Use  . Smoking status: Current Every Day Smoker    Packs/day: 0.50    Years: 27.00    Pack years: 13.50  . Smokeless tobacco: Never Used  Substance Use Topics  . Alcohol use: No  . Drug use: No     Allergies   Demerol; Morphine and related; Other; Tape; and Meperidine   Review of Systems Review of Systems  A complete review of systems was obtained and all systems are negative except as noted in the HPI and PMH.   Physical Exam Updated Vital Signs BP (!) 123/94 (BP Location: Left Arm)   Pulse 66   Temp 98.1 F (36.7 C) (Oral)   Resp 16   SpO2 100%   Physical Exam  Constitutional: She is oriented to person, place, and time. She appears well-developed and well-nourished. No distress.  HENT:  Head: Normocephalic and atraumatic.  Mouth/Throat: Oropharynx is clear and moist.  Eyes: Conjunctivae and EOM are normal. Pupils are equal, round, and reactive to light.  Neck: Normal range of motion.  Cardiovascular: Normal rate, regular rhythm and intact distal pulses.  Pulmonary/Chest: Effort normal and breath sounds normal.  Abdominal: Soft. She exhibits no distension. There is tenderness. There is no rebound and no guarding. No hernia.  Mild left-sided CVA tenderness to percussion.  Very mild left lower quadrant tenderness with no guarding or rebound.  Musculoskeletal: Normal range of motion.  Neurological: She is alert and oriented to person, place, and time.  Skin: She is not diaphoretic.  Psychiatric: She has a normal mood and affect.  Nursing note and vitals reviewed.    ED Treatments / Results  Labs (all labs ordered are listed, but only abnormal results are displayed) Labs Reviewed  URINALYSIS, ROUTINE W REFLEX MICROSCOPIC - Abnormal; Notable for the  following components:      Result Value   Hgb urine dipstick MODERATE (*)    Ketones, ur 5 (*)    Bacteria, UA FEW (*)    Squamous Epithelial / LPF 0-5 (*)    All other components within normal limits  BASIC METABOLIC PANEL - Abnormal; Notable for the following components:   Glucose, Bld 150 (*)    All other components within normal limits  CBC    EKG  EKG Interpretation None       Radiology Ct Abdomen Pelvis W Contrast  Result Date: 07/18/2017 CLINICAL DATA:  Left flank pain ongoing for week. EXAM: CT ABDOMEN AND PELVIS WITH CONTRAST TECHNIQUE: Multidetector CT imaging of the abdomen and pelvis was performed using the standard protocol following bolus administration of intravenous contrast. CONTRAST:  ISOVUE-300 IOPAMIDOL (ISOVUE-300) INJECTION 61% COMPARISON:  03/26/2017  and ultrasound of 07/15/2017 FINDINGS: Lower chest: Unremarkable Hepatobiliary: Mild hepatic heterogeneity attributable to geographic hepatic steatosis. Cholecystectomy. Pancreas: Unremarkable Spleen: Unremarkable Adrenals/Urinary Tract: The adrenal glands appear unremarkable. There is mild left hydronephrosis due to a 4 mm stone at the left UPJ. There is also a 2.1 cm cyst of the left kidney upper pole. No other urinary tract calculi are identified. The ureter distal to the stone is of normal caliber. Stomach/Bowel: Unremarkable. High density along the cecum is likely due to flocculated contrast. Vascular/Lymphatic: Aortoiliac atherosclerotic vascular disease. Peripancreatic node borderline prominent at 1.0 cm on image 21/2, previously 1.0 cm. Reproductive: Uterus absent.  Adnexa unremarkable. Other: No supplemental non-categorized findings. Musculoskeletal: Left sacral plexus stimulator noted. Otherwise unremarkable. IMPRESSION: 1. There is mild left hydronephrosis due to an obstructive 4 mm left ureteropelvic junction calculus. No other urinary tract calculi observed. 2. Other imaging findings of potential clinical  significance: Geographic hepatic steatosis. 2.1 cm cyst of the left kidney upper pole. Aortoiliac atherosclerotic vascular disease. Left sacral plexus stimulator noted. Electronically Signed   By: Gaylyn Rong M.D.   On: 07/18/2017 18:54    Procedures Procedures (including critical care time)  Medications Ordered in ED Medications  HYDROmorphone (DILAUDID) injection 0.5 mg (0.5 mg Intravenous Given 07/18/17 1826)  ondansetron (ZOFRAN) injection 4 mg (4 mg Intravenous Given 07/18/17 1825)  iopamidol (ISOVUE-300) 61 % injection (100 mLs  Contrast Given 07/18/17 1832)  HYDROmorphone (DILAUDID) injection 0.5 mg (0.5 mg Intravenous Given 07/18/17 1905)     Initial Impression / Assessment and Plan / ED Course  I have reviewed the triage vital signs and the nursing notes.  Pertinent labs & imaging results that were available during my care of the patient were reviewed by me and considered in my medical decision making (see chart for details).     Vitals:   07/18/17 1550 07/18/17 1600 07/18/17 1816 07/18/17 1908  BP: (!) 130/98  (!) 153/78 (!) 123/94  Pulse: 71 63 63 66  Resp: 20  18 16   Temp: 98.3 F (36.8 C)   98.1 F (36.7 C)  TempSrc: Oral   Oral  SpO2: 100% 100% 100% 100%    Medications  HYDROmorphone (DILAUDID) injection 0.5 mg (0.5 mg Intravenous Given 07/18/17 1826)  ondansetron (ZOFRAN) injection 4 mg (4 mg Intravenous Given 07/18/17 1825)  iopamidol (ISOVUE-300) 61 % injection (100 mLs  Contrast Given 07/18/17 1832)  HYDROmorphone (DILAUDID) injection 0.5 mg (0.5 mg Intravenous Given 07/18/17 1905)    Barbara Wu is 47 y.o. female presenting with left lower quadrant pain radiating up to the left flank onset 5 days ago.  Consistent with prior kidney stones.  She states that the pain is not alleviated with Percocet and ibuprofen.  Urinalysis is without signs of infection.  Blood work reassuring.  Will obtain CT.  CT with a few nonacute abnormalities that I have advised her  to follow with her primary care physician on, it also reveals a 4 mm left UVJ stone.  Patient states that she is always required intervention to pass her stones in the past.  She follows at [facility].  She has had multiple urologic procedures.  For this reason I will hold Toradol, advised her to follow closely with urology, she understands she can return to the ED at any time.  Short prescription of Percocet given.  She states that she has Flomax at home.  Evaluation does not show pathology that would require ongoing emergent intervention or inpatient treatment.  Pt is hemodynamically stable and mentating appropriately. Discussed findings and plan with patient/guardian, who agrees with care plan. All questions answered. Return precautions discussed and outpatient follow up given.      Final Clinical Impressions(s) / ED Diagnoses   Final diagnoses:  Renal colic on left side    ED Discharge Orders        Ordered    oxyCODONE-acetaminophen (PERCOCET) 5-325 MG tablet  Every 4 hours PRN     07/18/17 1908       Jathen Sudano, Mardella Layman 07/18/17 1915    Ziona Wickens, [employer], PA-C 07/23/17 0557    Rolan Bucco, MD 07/26/17 [phone]

## 2017-07-18 NOTE — ED Notes (Signed)
IV Team at bedside 

## 2017-07-18 NOTE — Discharge Instructions (Signed)
Please [facility] first thing on Monday.  For pain control please take ibuprofen (also known as Motrin or Advil) 800mg  (this is normally 4 over the counter pills) 3 times a day  for 5 days. Take with food to minimize stomach irritation.  Take percocet for breakthrough pain, do not drink alcohol, drive, care for children or do other critical tasks while taking percocet.  Please follow with your primary care doctor in the next 2 days for a check-up. They must obtain records for further management.   Do not hesitate to return to the Emergency Department for any new, worsening or concerning symptoms.

## 2017-07-18 NOTE — ED Triage Notes (Signed)
Pt complaint of left flank pain ongoing for a week; continues to verbalize diarrhea; took laxative.

## 2017-07-18 NOTE — ED Notes (Signed)
Lequita HaltMorgan RN unsuccessful US IV x2.

## 2017-07-18 NOTE — ED Notes (Signed)
Lequita HaltMorgan RN to place IV using [place]

## 2017-07-18 NOTE — ED Notes (Signed)
Pt VSS.  Pt reports much pain in LLQ (almost up against hip joint)

## 2018-10-26 IMAGING — CT CT ABD-PELV W/ CM
2 of 5 series · 16 of 46 positions shown, 18 images · IV contrast (ISOVUE)
Comparison: 03/26/2017 and ultrasound of 07/15/2017

CLINICAL DATA: Left flank pain ongoing for week.

EXAM:
CT ABDOMEN AND PELVIS WITH CONTRAST
TECHNIQUE: Multidetector CT imaging of the abdomen and pelvis was performed
using the standard protocol following bolus administration of
intravenous contrast.
CONTRAST:  100mL R4W1WZ-V00 IOPAMIDOL (R4W1WZ-V00) INJECTION 61%

[Series 2: axial st · axial · 0.97mm/px · z∈[+1188,+1588]mm · 13 of 94 slices shown, 15 images]
[im 7/94  soft-tissue]
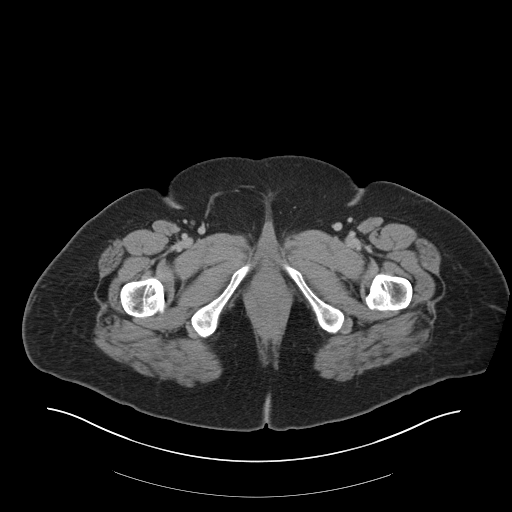
[im 7/94  bone]
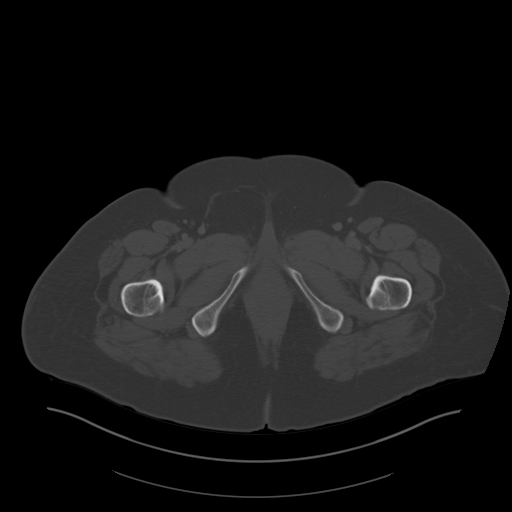
[im 13/94  soft-tissue]
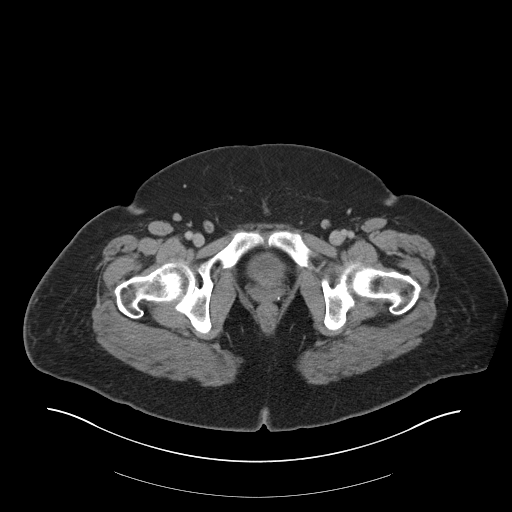
[im 19/94  soft-tissue]
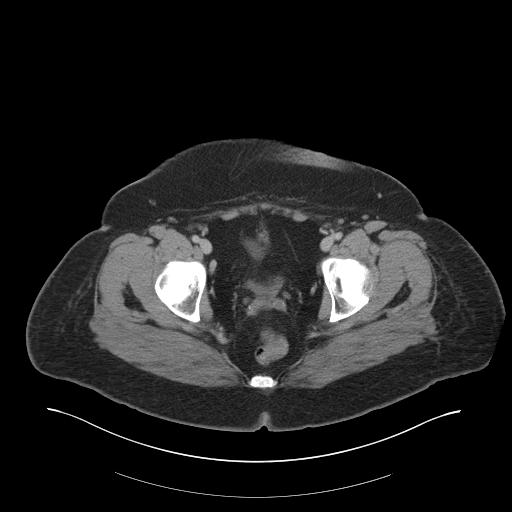
[im 25/94  soft-tissue]
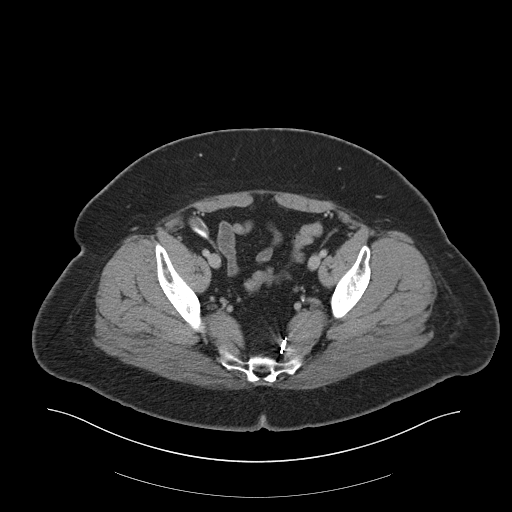
[im 32/94  soft-tissue]
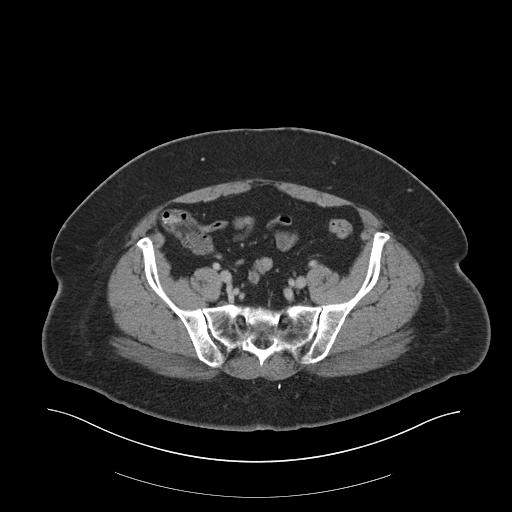
[im 38/94  soft-tissue]
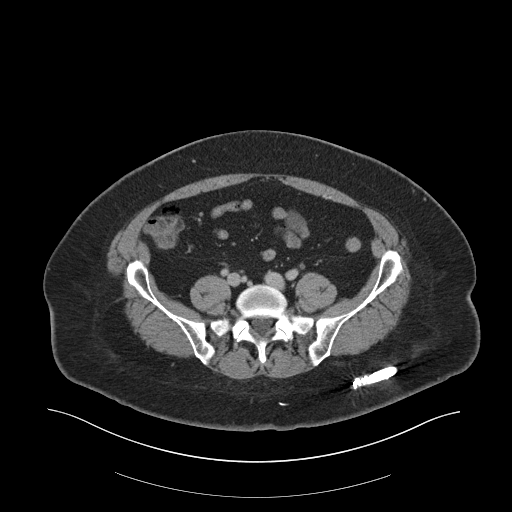
[im 50/94  soft-tissue]
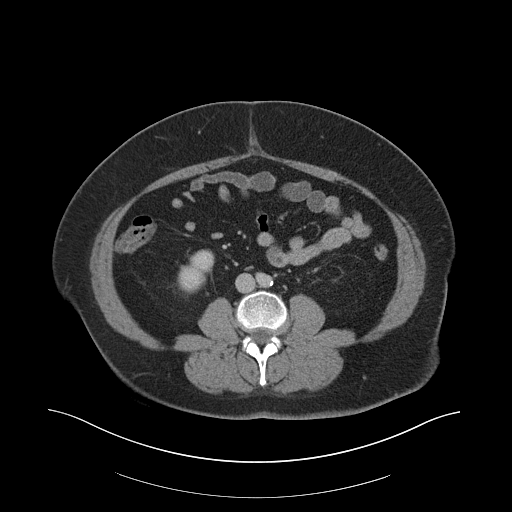
[im 56/94  soft-tissue]
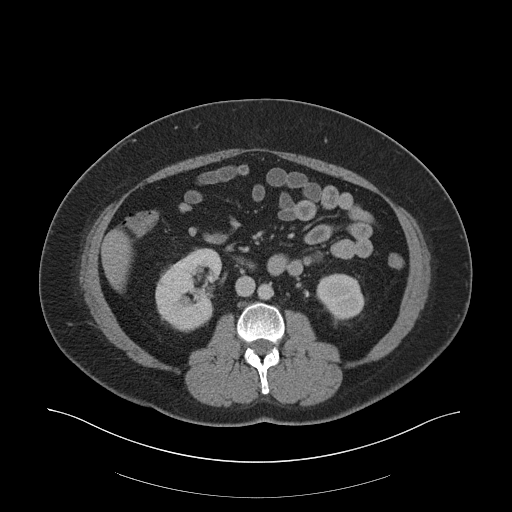
[im 63/94  soft-tissue]
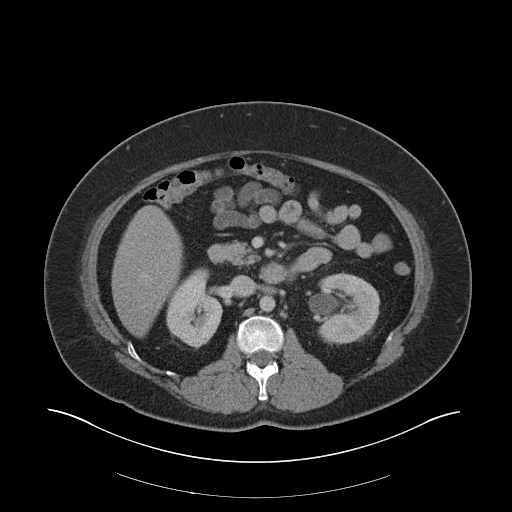
[im 63/94  bone]
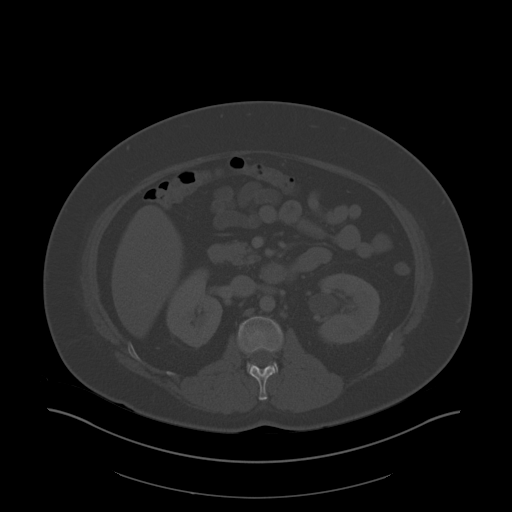
[im 69/94  soft-tissue]
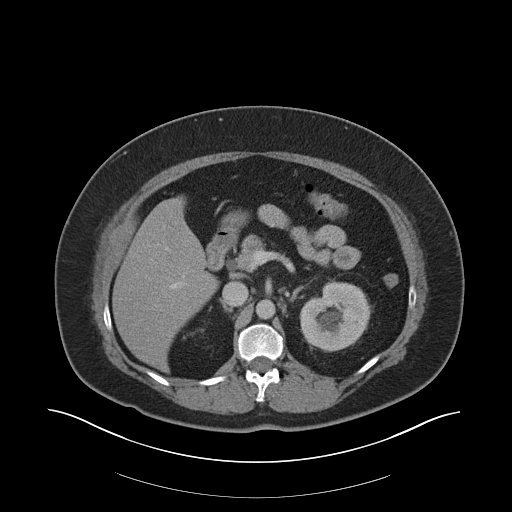
[im 75/94  soft-tissue]
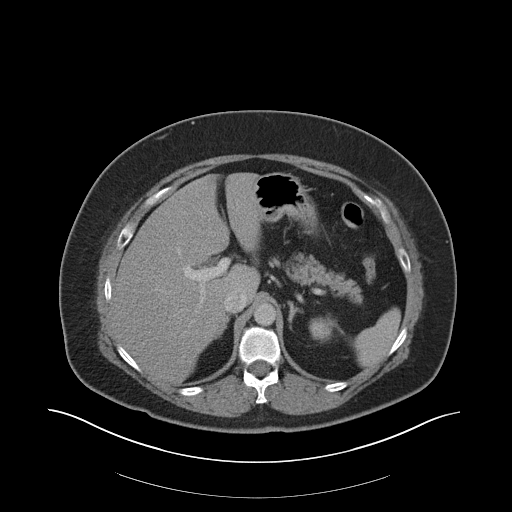
[im 81/94  soft-tissue]
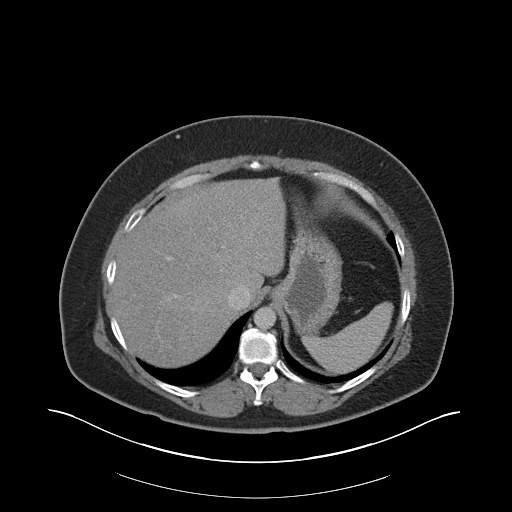
[im 87/94  soft-tissue]
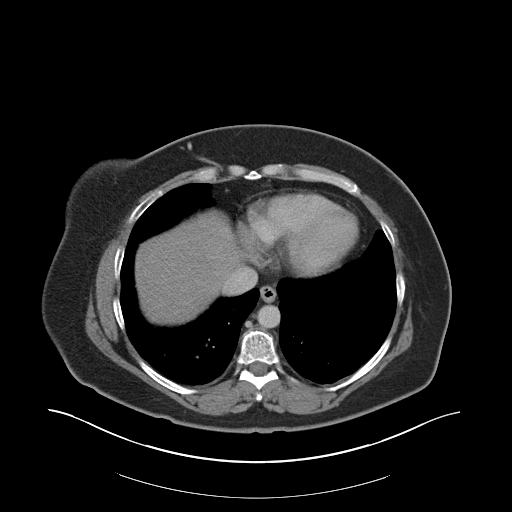

[Series 5: coronal st · coronal · 0.84mm/px · 3 of 111 slices shown]
[im 37/111  soft-tissue]
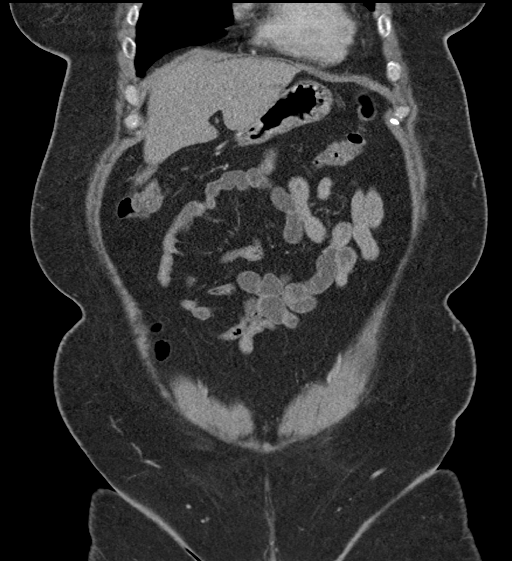
[im 49/111  soft-tissue]
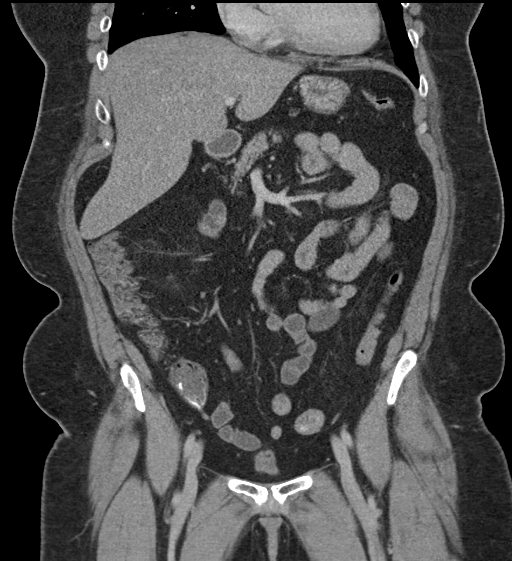
[im 62/111  soft-tissue]
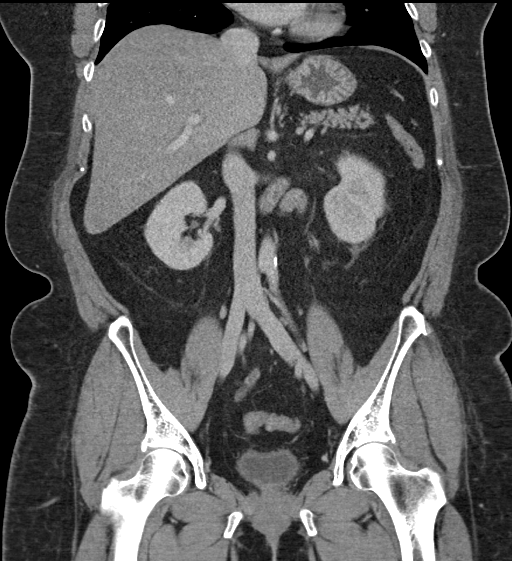

[16 of 46 positions shown; findings below may reference images not displayed]

FINDINGS: Lower chest: Unremarkable

Hepatobiliary: Mild hepatic heterogeneity attributable to geographic
hepatic steatosis. Cholecystectomy.

Pancreas: Unremarkable

Spleen: Unremarkable

Adrenals/Urinary Tract: The adrenal glands appear unremarkable.
There is mild left hydronephrosis due to a 4 mm stone at the left
UPJ.

There is also a 2.1 cm cyst of the left kidney upper pole.

No other urinary tract calculi are identified. The ureter distal to
the stone is of normal caliber.

Stomach/Bowel: Unremarkable. High density along the cecum is likely
due to flocculated contrast.

Vascular/Lymphatic: Aortoiliac atherosclerotic vascular disease.
Peripancreatic node borderline prominent at 1.0 cm on image [DATE],
previously 1.0 cm.

Reproductive: Uterus absent.  Adnexa unremarkable.

Other: No supplemental non-categorized findings.

Musculoskeletal: Left sacral plexus stimulator noted. Otherwise
unremarkable.
IMPRESSION: 1. There is mild left hydronephrosis due to an obstructive 4 mm left
ureteropelvic junction calculus. No other urinary tract calculi
observed.
2. Other imaging findings of potential clinical significance:
Geographic hepatic steatosis. 2.1 cm cyst of the left kidney upper
pole. Aortoiliac atherosclerotic vascular disease. Left sacral
plexus stimulator noted.

## 2019-06-10 ENCOUNTER — Other Ambulatory Visit: Payer: Self-pay | Admitting: Orthopedic Surgery

## 2019-06-10 DIAGNOSIS — M25569 Pain in unspecified knee: Secondary | ICD-10-CM

## 2019-06-22 ENCOUNTER — Ambulatory Visit
Admission: RE | Admit: 2019-06-22 | Discharge: 2019-06-22 | Disposition: A | Discharge status: Home / Self Care | Payer: Medicare Other | Source: Ambulatory Visit | Attending: Orthopedic Surgery | Admitting: Orthopedic Surgery

## 2019-06-22 ENCOUNTER — Other Ambulatory Visit: Payer: Self-pay

## 2019-06-22 DIAGNOSIS — M25569 Pain in unspecified knee: Secondary | ICD-10-CM

## 2019-06-22 MED ORDER — IOPAMIDOL (ISOVUE-M 200) INJECTION 41%
40.0000 mL | Freq: Once | INTRAMUSCULAR | Status: AC
  Administered 2019-06-22: 40 mL via INTRA_ARTICULAR

## 2019-09-28 ENCOUNTER — Ambulatory Visit (INDEPENDENT_AMBULATORY_CARE_PROVIDER_SITE_OTHER): Payer: Medicare Other | Admitting: Licensed Clinical Social Worker

## 2019-09-28 DIAGNOSIS — F411 Generalized anxiety disorder: Secondary | ICD-10-CM | POA: Diagnosis not present
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
## 2021-01-21 ENCOUNTER — Encounter (HOSPITAL_COMMUNITY): Payer: Self-pay

## 2021-01-21 ENCOUNTER — Emergency Department (HOSPITAL_COMMUNITY)
Admission: EM | Admit: 2021-01-21 | Discharge: 2021-01-21 | Disposition: A | Discharge status: Home / Self Care | Payer: Medicare Other | Attending: Emergency Medicine | Admitting: Emergency Medicine

## 2021-01-21 ENCOUNTER — Emergency Department (HOSPITAL_COMMUNITY): Payer: Medicare Other

## 2021-01-21 ENCOUNTER — Other Ambulatory Visit: Payer: Self-pay

## 2021-01-21 DIAGNOSIS — E119 Type 2 diabetes mellitus without complications: Secondary | ICD-10-CM | POA: Insufficient documentation

## 2021-01-21 DIAGNOSIS — K219 Gastro-esophageal reflux disease without esophagitis: Secondary | ICD-10-CM | POA: Insufficient documentation

## 2021-01-21 DIAGNOSIS — F17210 Nicotine dependence, cigarettes, uncomplicated: Secondary | ICD-10-CM | POA: Diagnosis not present

## 2021-01-21 DIAGNOSIS — R109 Unspecified abdominal pain: Secondary | ICD-10-CM | POA: Diagnosis present

## 2021-01-21 DIAGNOSIS — N200 Calculus of kidney: Secondary | ICD-10-CM | POA: Diagnosis not present

## 2021-01-21 LAB — CBC WITH DIFFERENTIAL/PLATELET
Abs Immature Granulocytes: 0.02 10*3/uL (ref 0.00–0.07)
Basophils Absolute: 0 10*3/uL (ref 0.0–0.1)
Basophils Relative: 1 %
Eosinophils Absolute: 0.1 10*3/uL (ref 0.0–0.5)
Eosinophils Relative: 2 %
HCT: 42.6 % (ref 36.0–46.0)
Hemoglobin: 14.6 g/dL (ref 12.0–15.0)
Immature Granulocytes: 0 %
Lymphocytes Relative: 38 %
Lymphs Abs: 2.8 10*3/uL (ref 0.7–4.0)
MCH: 31.5 pg (ref 26.0–34.0)
MCHC: 34.3 g/dL (ref 30.0–36.0)
MCV: 92 fL (ref 80.0–100.0)
Monocytes Absolute: 0.5 10*3/uL (ref 0.1–1.0)
Monocytes Relative: 6 %
Neutro Abs: 3.9 10*3/uL (ref 1.7–7.7)
Neutrophils Relative %: 53 %
Platelets: 275 10*3/uL (ref 150–400)
RBC: 4.63 MIL/uL (ref 3.87–5.11)
RDW: 12.2 % (ref 11.5–15.5)
WBC: 7.3 10*3/uL (ref 4.0–10.5)
nRBC: 0 % (ref 0.0–0.2)

## 2021-01-21 LAB — COMPREHENSIVE METABOLIC PANEL
ALT: 20 U/L (ref 0–44)
AST: 16 U/L (ref 15–41)
Albumin: 4.7 g/dL (ref 3.5–5.0)
Alkaline Phosphatase: 44 U/L (ref 38–126)
Anion gap: 10 (ref 5–15)
BUN: 21 mg/dL — ABNORMAL HIGH (ref 6–20)
CO2: 23 mmol/L (ref 22–32)
Calcium: 9.7 mg/dL (ref 8.9–10.3)
Chloride: 109 mmol/L (ref 98–111)
Creatinine, Ser: 0.7 mg/dL (ref 0.44–1.00)
GFR, Estimated: >60 mL/min (ref >60)
Glucose, Bld: 176 mg/dL — ABNORMAL HIGH (ref 70–99)
Potassium: 4.2 mmol/L (ref 3.5–5.1)
Sodium: 142 mmol/L (ref 135–145)
Total Bilirubin: 0.8 mg/dL (ref 0.3–1.2)
Total Protein: 7.5 g/dL (ref 6.5–8.1)

## 2021-01-21 LAB — URINALYSIS, ROUTINE W REFLEX MICROSCOPIC
Bilirubin Urine: NEGATIVE
Glucose, UA: 50 mg/dL — AB
Ketones, ur: 20 mg/dL — AB
Leukocytes,Ua: NEGATIVE
Nitrite: NEGATIVE
Protein, ur: 30 mg/dL — AB
Specific Gravity, Urine: 1.02 (ref 1.005–1.030)
pH: 5 (ref 5.0–8.0)

## 2021-01-21 LAB — LIPASE, BLOOD: Lipase: 21 U/L (ref 11–51)

## 2021-01-21 MED ORDER — FENTANYL CITRATE PF 50 MCG/ML IJ SOSY
50.0000 ug | PREFILLED_SYRINGE | Freq: Once | INTRAMUSCULAR | Status: AC
  Administered 2021-01-21: 50 ug via INTRAVENOUS
  Filled 2021-01-21: qty 1

## 2021-01-21 MED ORDER — ONDANSETRON HCL 4 MG/2ML IJ SOLN
4.0000 mg | Freq: Once | INTRAMUSCULAR | Status: AC
  Administered 2021-01-21: 4 mg via INTRAVENOUS
  Filled 2021-01-21: qty 2

## 2021-01-21 MED ORDER — OXYCODONE HCL 5 MG PO TABS: 5.0000 mg | ORAL_TABLET | Freq: Four times a day (QID) | ORAL | 0 refills | Status: DC | PRN

## 2021-01-21 MED ORDER — SODIUM CHLORIDE 0.9 % IV BOLUS
1000.0000 mL | Freq: Once | INTRAVENOUS | Status: AC
  Administered 2021-01-21: 1000 mL via INTRAVENOUS

## 2021-01-21 MED ORDER — KETOROLAC TROMETHAMINE 15 MG/ML IJ SOLN
15.0000 mg | Freq: Once | INTRAMUSCULAR | Status: AC
  Administered 2021-01-21: 15 mg via INTRAVENOUS
  Filled 2021-01-21: qty 1

## 2021-01-21 MED ORDER — ONDANSETRON HCL 4 MG PO TABS: 4.0000 mg | ORAL_TABLET | Freq: Four times a day (QID) | ORAL | 0 refills | Status: DC

## 2021-01-21 MED ORDER — OXYCODONE HCL 5 MG PO TABS
5.0000 mg | ORAL_TABLET | Freq: Once | ORAL | Status: AC
  Administered 2021-01-21: 5 mg via ORAL
  Filled 2021-01-21: qty 1

## 2021-01-21 NOTE — Discharge Instructions (Addendum)
You have a kidney stone that I suspect will pass.  If you develop any fever or worsening pain please return for evaluation.  Recommend 1000 mg of Tylenol every 6 hours as needed for pain.  Recommend 800 mg ibuprofen every 8 hours as needed for pain.  Take Roxicodone for breakthrough pain.  Take Zofran as needed for nausea.  Your prescribed a narcotic pain medicine so be careful while using this medication.  Do not mix with any other alcohol or drugs.  Do not drive while taking this medicine.

## 2021-01-21 NOTE — ED Provider Notes (Signed)
[facility]-EMERGENCY DEPT Provider Note   CSN: [id number] Arrival date & time: 01/21/21  0735     History Chief Complaint  Patient presents with   Flank Pain    Barbara Wu is a 50 y.o. female.  Here with right flank pain, right-sided abdominal pain.  History of kidney stones.  Has had cholecystectomy in the past.  Denies any urinary symptoms.  The history is provided by the patient.  Flank Pain This is a new problem. The current episode started 6 to 12 hours ago. The problem occurs constantly. Associated symptoms include abdominal pain. Pertinent negatives include no chest pain, no headaches and no shortness of breath. Nothing aggravates the symptoms. Nothing relieves the symptoms. She has tried nothing for the symptoms. The treatment provided no relief.      Past Medical History:  Diagnosis Date   Arthritis    Biliary dyskinesia    Diabetes mellitus without complication (HCC)    diet controlled   Fibromyalgia    GERD (gastroesophageal reflux disease)    History of kidney stones    Interstitial cystitis    PONV (postoperative nausea and vomiting)    PONV (postoperative nausea and vomiting)    Renal disorder    RSD (reflex sympathetic dystrophy)     Patient Active Problem List   Diagnosis Date Noted   Biliary dyskinesia 01/21/2014   Interstitial cystitis    RSD (reflex sympathetic dystrophy)    PONV (postoperative nausea and vomiting)    Diabetes mellitus without complication (HCC)    Tear of left acetabular labrum    Chronic left hip pain     Past Surgical History:  Procedure Laterality Date   ABDOMINAL HYSTERECTOMY     ANTERIOR CRUCIATE LIGAMENT REPAIR     CARPAL TUNNEL RELEASE Bilateral    CESAREAN SECTION     CHOLECYSTECTOMY N/A 01/25/2014   Procedure: LAPAROSCOPIC CHOLECYSTECTOMY;  Surgeon: Abigail Miyamotoouglas Blackman, MD;  Location: [facility] ORS;  Service: General;  Laterality: N/A;   CYSTOSCOPY W/ URETERAL STENT PLACEMENT Right 11/23/2015    Procedure: CYSTOSCOPY WITH RETROGRADE PYELOGRAM, STONE BASKET EXTRACTION,URETERAL STENT PLACEMENT;  Surgeon: Malen GauzePatrick L McKenzie, MD;  Location: [facility];  Service: Urology;  Laterality: Right;   HIP ARTHROSCOPY Left 10/28/2012   Procedure: ARTHROSCOPY HIP- left;  Surgeon: Loreta Aveaniel F Murphy, MD;  [facility];  Service: Orthopedics;  Laterality: Left;   [facility]   bladder, Medtronic, serial # [id number], model [id number], info # [id number]   TONSILLECTOMY       OB History   No obstetric history on file.     Family History  Problem Relation Age of Onset   Seizures Unknown    Stroke Unknown     Social History   Tobacco Use   Smoking status: Every Day    Packs/day: 0.50    Years: 27.00    Pack years: 13.50    Types: Cigarettes   Smokeless tobacco: Never  Vaping Use   Vaping Use: Never used  Substance Use Topics   Alcohol use: No   Drug use: No    Home Medications Prior to Admission medications   Medication Sig Start Date End Date Taking? Authorizing Provider  ondansetron (ZOFRAN) 4 MG tablet Take 1 tablet (4 mg total) by mouth every 6 (six) hours. 01/21/21  Yes Pheobe Sandiford, DO  oxyCODONE (ROXICODONE) 5 MG immediate release tablet Take 1 tablet (5 mg total) by mouth every 6 (six) hours as needed for up  to 15 doses for severe pain. 01/21/21  Yes Clarabelle Oscarson, DO  amitriptyline (ELAVIL) 25 MG tablet Take 25 mg by mouth at bedtime.    [provider]  docusate sodium (PHILLIPS STOOL SOFTENER) 100 MG capsule Take 100 mg by mouth daily as needed for mild constipation.    [provider]  FLUoxetine (PROZAC) 20 MG capsule Take 20 mg by mouth daily.  02/11/17 02/11/18  [provider]  hydrOXYzine (ATARAX/VISTARIL) 25 MG tablet Take 25 mg by mouth at bedtime.    [provider]  ibuprofen (ADVIL,MOTRIN) 200 MG tablet Take 200 mg by mouth every 6 (six) hours as needed for moderate pain.    [provider]   oxyCODONE-acetaminophen (PERCOCET) 5-325 MG tablet Take 1 tablet by mouth every 4 (four) hours as needed. 07/18/17   Pisciotta, Joni Reining, PA-C  tamsulosin (FLOMAX) 0.4 MG CAPS capsule Take 1 capsule (0.4 mg total) by mouth daily. 07/15/17   Dione Booze, MD    Allergies    Demerol, Morphine and related, Other, Tape, and Meperidine  Review of Systems   Review of Systems  Constitutional:  Negative for chills and fever.  HENT:  Negative for ear pain and sore throat.   Eyes:  Negative for pain and visual disturbance.  Respiratory:  Negative for cough and shortness of breath.   Cardiovascular:  Negative for chest pain and palpitations.  Gastrointestinal:  Positive for abdominal pain. Negative for vomiting.  Genitourinary:  Positive for flank pain. Negative for dysuria and hematuria.  Musculoskeletal:  Negative for arthralgias and back pain.  Skin:  Negative for color change and rash.  Neurological:  Negative for seizures, syncope and headaches.  All other systems reviewed and are negative.  Physical Exam Updated Vital Signs BP 139/64   Pulse 64   Temp 97.9 F (36.6 C) (Oral)   Resp 17   SpO2 97%   Physical Exam Vitals and nursing note reviewed.  Constitutional:      General: She is not in acute distress.    Appearance: She is well-developed. She is not ill-appearing.  HENT:     Head: Normocephalic and atraumatic.     Mouth/Throat:     Mouth: Mucous membranes are moist.  Eyes:     Extraocular Movements: Extraocular movements intact.     Conjunctiva/sclera: Conjunctivae normal.     Pupils: Pupils are equal, round, and reactive to light.  Cardiovascular:     Rate and Rhythm: Normal rate and regular rhythm.     Pulses: Normal pulses.     Heart sounds: Normal heart sounds. No murmur heard. Pulmonary:     Effort: Pulmonary effort is normal. No respiratory distress.     Breath sounds: Normal breath sounds.  Abdominal:     Palpations: Abdomen is soft.     Tenderness: There is no  abdominal tenderness. There is right CVA tenderness.  Musculoskeletal:     Cervical back: Normal range of motion and neck supple.  Skin:    General: Skin is warm and dry.     Capillary Refill: Capillary refill takes less than 2 seconds.  Neurological:     Mental Status: She is alert.  Psychiatric:        Mood and Affect: Mood normal.    ED Results / Procedures / Treatments   Labs (all labs ordered are listed, but only abnormal results are displayed) Labs Reviewed  COMPREHENSIVE METABOLIC PANEL - Abnormal; Notable for the following components:      Result  Value   Glucose, Bld 176 (*)    BUN 21 (*)    All other components within normal limits  URINALYSIS, ROUTINE W REFLEX MICROSCOPIC - Abnormal; Notable for the following components:   APPearance HAZY (*)    Glucose, UA 50 (*)    Hgb urine dipstick LARGE (*)    Ketones, ur 20 (*)    Protein, ur 30 (*)    Bacteria, UA RARE (*)    All other components within normal limits  CBC WITH DIFFERENTIAL/PLATELET  LIPASE, BLOOD    EKG None  Radiology CT Renal Stone Study  Result Date: 01/21/2021 CLINICAL DATA:  Flank pain EXAM: CT ABDOMEN AND PELVIS WITHOUT CONTRAST TECHNIQUE: Multidetector CT imaging of the abdomen and pelvis was performed following the standard protocol without IV contrast. COMPARISON:  CT abdomen dated 07/18/2017. FINDINGS: Lower chest: No acute abnormality. Hepatobiliary: No focal liver abnormality is seen. Status post cholecystectomy. No biliary dilatation. Pancreas: Unremarkable. No pancreatic ductal dilatation or surrounding inflammatory changes. Spleen: Normal in size without focal abnormality. Adrenals/Urinary Tract: Adrenal glands appear normal. Moderate RIGHT-sided hydronephrosis caused by a 2 mm stone within the proximal RIGHT ureter. No additional ureteral or bladder stone. LEFT renal cyst. Bladder is decompressed. Stomach/Bowel: No dilated large or small bowel loops. No evidence of bowel wall inflammation.  Appendix is normal. Stomach is unremarkable, partially decompressed. Vascular/Lymphatic: Aortic atherosclerosis. No enlarged lymph nodes are seen. Reproductive: Presumed hysterectomy.  No adnexal mass or free fluid. Other: No free fluid or abscess collection. No free intraperitoneal air. Musculoskeletal: No acute or significant osseous findings. Mild degenerative spurring within the lower thoracic spine. IMPRESSION: 1. 2 mm stone within the proximal RIGHT ureter, causing moderate RIGHT-sided hydronephrosis. 2. Additional chronic/incidental findings detailed above. Aortic Atherosclerosis (ICD10-I70.0). Electronically Signed   By: Bary Richard M.D.   On: 01/21/2021 08:45    Procedures Procedures   Medications Ordered in ED Medications  ketorolac (TORADOL) 15 MG/ML injection 15 mg (has no administration in time range)  oxyCODONE (Oxy IR/ROXICODONE) immediate release tablet 5 mg (has no administration in time range)  fentaNYL (SUBLIMAZE) injection 50 mcg (50 mcg Intravenous Given 01/21/21 0800)  ondansetron (ZOFRAN) injection 4 mg (4 mg Intravenous Given 01/21/21 0800)  sodium chloride 0.9 % bolus 1,000 mL (1,000 mLs Intravenous New Bag/Given 01/21/21 0800)    ED Course  I have reviewed the triage vital signs and the nursing notes.  Pertinent labs & imaging results that were available during my care of the patient were reviewed by me and considered in my medical decision making (see chart for details).    MDM Rules/Calculators/A&P                           9:36 AM  Barbara Wu is here for right flank pain that started overnight.  History of kidney stones.  Normal vitals.  No fever.  Pain in the right flank radiating around to the right abdomen.  Has had gallbladder removed in the past and doubt cholecystitis.  Differential includes kidney stone versus pancreatitis versus appendicitis versus less likely bowel obstruction.  Will check basic labs, give IV fentanyl, IV Zofran, normal saline bolus.   We will get CT scan of the abdomen and pelvis.  Will reevaluate.  9:36 AM  Patient with 2 mm kidney stone on the right.  No signs of urine infection.  No leukocytosis.  No fever.  Overall uncomplicated kidney stone.  Feeling better after pain  medication.  We will have her follow-up with urology and understands return precautions.  Discharged with pain and nausea medicine.  This chart was dictated using voice recognition software.  Despite best efforts to proofread,  errors can occur which can change the documentation meaning.   Final Clinical Impression(s) / ED Diagnoses Final diagnoses:  Kidney stone    Rx / DC Orders ED Discharge Orders          Ordered    ondansetron (ZOFRAN) 4 MG tablet  Every 6 hours        01/21/21 0934    oxyCODONE (ROXICODONE) 5 MG immediate release tablet  Every 6 hours PRN        01/21/21 0934             Virgina Norfolk, DO 01/21/21 [phone]

## 2021-01-21 NOTE — ED Triage Notes (Signed)
Pt reports right flank pain and vomiting since last night. Pt has hx of kidney stones.

## 2021-06-26 ENCOUNTER — Emergency Department (HOSPITAL_BASED_OUTPATIENT_CLINIC_OR_DEPARTMENT_OTHER)
Admission: EM | Admit: 2021-06-26 | Discharge: 2021-06-26 | Disposition: A | Discharge status: Home / Self Care | Payer: Medicare Other | Attending: Emergency Medicine | Admitting: Emergency Medicine

## 2021-06-26 ENCOUNTER — Emergency Department (HOSPITAL_BASED_OUTPATIENT_CLINIC_OR_DEPARTMENT_OTHER): Payer: Medicare Other | Admitting: Radiology

## 2021-06-26 ENCOUNTER — Encounter (HOSPITAL_BASED_OUTPATIENT_CLINIC_OR_DEPARTMENT_OTHER): Payer: Self-pay | Admitting: Urology

## 2021-06-26 ENCOUNTER — Other Ambulatory Visit: Payer: Self-pay

## 2021-06-26 DIAGNOSIS — M79645 Pain in left finger(s): Secondary | ICD-10-CM

## 2021-06-26 DIAGNOSIS — M5432 Sciatica, left side: Secondary | ICD-10-CM

## 2021-06-26 MED ORDER — IBUPROFEN 800 MG PO TABS: 800.0000 mg | ORAL_TABLET | Freq: Three times a day (TID) | ORAL | 0 refills | Status: DC

## 2021-06-26 MED ORDER — HYDROCODONE-ACETAMINOPHEN 5-325 MG PO TABS: 1.0000 | ORAL_TABLET | Freq: Four times a day (QID) | ORAL | 0 refills | Status: DC | PRN

## 2021-06-26 MED ORDER — METHYLPREDNISOLONE SODIUM SUCC 125 MG IJ SOLR
62.5000 mg | Freq: Once | INTRAMUSCULAR | Status: AC
  Administered 2021-06-26: 62.5 mg via INTRAMUSCULAR
  Filled 2021-06-26: qty 2

## 2021-06-26 NOTE — ED Provider Notes (Signed)
[facility] EMERGENCY DEPT Provider Note   CSN: [id number] Arrival date & time: 06/26/21  1031     History  Chief Complaint  Patient presents with   Lytle Michaels    Barbara Wu is a 51 y.o. female.  Patient is a 51 yo female presenting for mechanical fall 5-6 days ago with residual left hip and left index finger pain. Denies head trauma, loc, blood thinner use, or any sensation/motor deficits.   The history is provided by the patient. No language interpreter was used.  Fall Pertinent negatives include no chest pain, no abdominal pain and no shortness of breath.      Home Medications Prior to Admission medications   Medication Sig Start Date End Date Taking? Authorizing Provider  amitriptyline (ELAVIL) 25 MG tablet Take 25 mg by mouth at bedtime.    [provider]  docusate sodium (PHILLIPS STOOL SOFTENER) 100 MG capsule Take 100 mg by mouth daily as needed for mild constipation.    [provider]  FLUoxetine (PROZAC) 20 MG capsule Take 20 mg by mouth daily.  02/11/17 02/11/18  [provider]  hydrOXYzine (ATARAX/VISTARIL) 25 MG tablet Take 25 mg by mouth at bedtime.    [provider]  ibuprofen (ADVIL,MOTRIN) 200 MG tablet Take 200 mg by mouth every 6 (six) hours as needed for moderate pain.    [provider]  ondansetron (ZOFRAN) 4 MG tablet Take 1 tablet (4 mg total) by mouth every 6 (six) hours. 01/21/21   Curatolo, Adam, DO  oxyCODONE (ROXICODONE) 5 MG immediate release tablet Take 1 tablet (5 mg total) by mouth every 6 (six) hours as needed for up to 15 doses for severe pain. 01/21/21   Curatolo, Adam, DO  oxyCODONE-acetaminophen (PERCOCET) 5-325 MG tablet Take 1 tablet by mouth every 4 (four) hours as needed. 07/18/17   Pisciotta, Elmyra Ricks, PA-C  tamsulosin (FLOMAX) 0.4 MG CAPS capsule Take 1 capsule (0.4 mg total) by mouth daily. [id number]   Delora Fuel, MD      Allergies    Demerol, Morphine and related, Other, Tape,  and Meperidine    Review of Systems   Review of Systems  Constitutional:  Negative for chills and fever.  HENT:  Negative for ear pain and sore throat.   Eyes:  Negative for pain and visual disturbance.  Respiratory:  Negative for cough and shortness of breath.   Cardiovascular:  Negative for chest pain and palpitations.  Gastrointestinal:  Negative for abdominal pain and vomiting.  Genitourinary:  Negative for dysuria and hematuria.  Musculoskeletal:  Negative for arthralgias and back pain.  Skin:  Negative for color change and rash.  Neurological:  Negative for seizures and syncope.  All other systems reviewed and are negative.  Physical Exam Updated Vital Signs BP (!) 154/101 (BP Location: Right Arm)    Pulse 77    Temp 99.3 F (37.4 C)    Resp 18    Ht 5\' 2"  (1.575 m)    Wt 99.8 kg    SpO2 97%    BMI 40.24 kg/m  Physical Exam Vitals and nursing note reviewed.  Constitutional:      General: She is not in acute distress.    Appearance: She is well-developed.  HENT:     Head: Normocephalic and atraumatic.  Eyes:     Conjunctiva/sclera: Conjunctivae normal.  Cardiovascular:     Rate and Rhythm: Normal rate and regular rhythm.     Heart sounds: No murmur heard. Pulmonary:  Effort: Pulmonary effort is normal. No respiratory distress.     Breath sounds: Normal breath sounds.  Abdominal:     Palpations: Abdomen is soft.     Tenderness: There is no abdominal tenderness.  Musculoskeletal:        General: No swelling.       Arms:     Cervical back: Neck supple. No bony tenderness.     Thoracic back: No bony tenderness.     Lumbar back: No bony tenderness.       Legs:  Skin:    General: Skin is warm and dry.     Capillary Refill: Capillary refill takes less than 2 seconds.  Neurological:     Mental Status: She is alert.  Psychiatric:        Mood and Affect: Mood normal.    ED Results / Procedures / Treatments   Labs (all labs ordered are listed, but only abnormal  results are displayed) Labs Reviewed - No data to display  EKG None  Radiology DG Finger Index Left  Result Date: 06/26/2021 CLINICAL DATA:  Fall with left index finger pain. EXAM: LEFT INDEX FINGER 2+V COMPARISON:  None. FINDINGS: There is no evidence of fracture or dislocation. There is no evidence of arthropathy or other focal bone abnormality. Soft tissues are unremarkable. IMPRESSION: Negative. Electronically Signed   By: Kennith Center M.D.   On: 06/26/2021 11:04   DG Hip Unilat W or Wo Pelvis 2-3 Views Left  Result Date: 06/26/2021 CLINICAL DATA:  Left hip pain EXAM: DG HIP (WITH OR WITHOUT PELVIS) 2-3V LEFT COMPARISON:  None. FINDINGS: No acute fracture or dislocation. No aggressive osseous lesion. Normal alignment. Generalized osteopenia. Sacral stimulator noted along the left side of the sacrum. Soft tissue are unremarkable. No radiopaque foreign body or soft tissue emphysema. IMPRESSION: No acute osseous injury of the left hip. Given the patient's age and osteopenia, if there is persistent clinical concern for an occult hip fracture, a CT of the hip is recommended for increased sensitivity. Electronically Signed   By: Elige Ko M.D.   On: 06/26/2021 11:04    Procedures Procedures    Medications Ordered in ED Medications - No data to display  ED Course/ Medical Decision Making/ A&P                           Medical Decision Making Amount and/or Complexity of Data Reviewed Radiology: ordered.  Risk Prescription drug management.   12:16 PM  51 yo female presenting for mechanical fall 5-6 days ago with residual left hip and left index finger pain.  Xray of finger and hip demonstrates no fractures. Finger splint applied for comfort. Pt describes lightening like shooting pain down left leg when attempting to ambulate concerning for sciatica. No neurovascular deficits on exam. Medication given for pain control with stretching exercises.   Patient in no distress and overall  condition improved here in the ED. Detailed discussions were had with the patient regarding current findings, and need for close f/u with orthopedic surgery. The patient has been instructed to return immediately if the symptoms worsen in any way for re-evaluation. Patient verbalized understanding and is in agreement with current care plan. All questions answered prior to discharge.        Final Clinical Impression(s) / ED Diagnoses Final diagnoses:  Sciatica of left side  Pain of finger of left hand    Rx / DC Orders ED Discharge Orders  None         Lianne Cure, DO [id number]

## 2021-06-26 NOTE — ED Triage Notes (Signed)
Fall on Thursday on ice on porch  Left hip pain and left hand/pointer finger pain

## 2021-06-26 NOTE — ED Notes (Signed)
Dc instructions and scripts reviewed with pt no questions or concerns at this time. Will follow up as needed. Ambulated without difficulty out of ED.

## 2021-08-25 ENCOUNTER — Emergency Department (HOSPITAL_COMMUNITY): Payer: Medicare Other

## 2021-08-25 ENCOUNTER — Encounter (HOSPITAL_COMMUNITY): Payer: Self-pay | Admitting: Emergency Medicine

## 2021-08-25 ENCOUNTER — Other Ambulatory Visit: Payer: Self-pay

## 2021-08-25 ENCOUNTER — Inpatient Hospital Stay (HOSPITAL_COMMUNITY)
Admission: EM | Admit: 2021-08-25 | Discharge: 2021-08-28 | DRG: 192 | Disposition: A | Discharge status: Home / Self Care | Payer: Medicare Other | Attending: Family Medicine | Admitting: Family Medicine

## 2021-08-25 DIAGNOSIS — J189 Pneumonia, unspecified organism: Secondary | ICD-10-CM

## 2021-08-25 DIAGNOSIS — Z79899 Other long term (current) drug therapy: Secondary | ICD-10-CM

## 2021-08-25 DIAGNOSIS — J441 Chronic obstructive pulmonary disease with (acute) exacerbation: Principal | ICD-10-CM | POA: Diagnosis present

## 2021-08-25 DIAGNOSIS — I10 Essential (primary) hypertension: Secondary | ICD-10-CM

## 2021-08-25 DIAGNOSIS — Z885 Allergy status to narcotic agent status: Secondary | ICD-10-CM

## 2021-08-25 DIAGNOSIS — R062 Wheezing: Secondary | ICD-10-CM | POA: Diagnosis not present

## 2021-08-25 DIAGNOSIS — E119 Type 2 diabetes mellitus without complications: Secondary | ICD-10-CM | POA: Diagnosis not present

## 2021-08-25 DIAGNOSIS — Z91018 Allergy to other foods: Secondary | ICD-10-CM

## 2021-08-25 DIAGNOSIS — Z888 Allergy status to other drugs, medicaments and biological substances status: Secondary | ICD-10-CM

## 2021-08-25 DIAGNOSIS — Z20822 Contact with and (suspected) exposure to covid-19: Secondary | ICD-10-CM | POA: Diagnosis present

## 2021-08-25 DIAGNOSIS — T380X5A Adverse effect of glucocorticoids and synthetic analogues, initial encounter: Secondary | ICD-10-CM | POA: Diagnosis not present

## 2021-08-25 DIAGNOSIS — M797 Fibromyalgia: Secondary | ICD-10-CM | POA: Diagnosis present

## 2021-08-25 DIAGNOSIS — F17210 Nicotine dependence, cigarettes, uncomplicated: Secondary | ICD-10-CM | POA: Diagnosis present

## 2021-08-25 DIAGNOSIS — R0602 Shortness of breath: Secondary | ICD-10-CM | POA: Diagnosis not present

## 2021-08-25 DIAGNOSIS — Z6838 Body mass index (BMI) 38.0-38.9, adult: Secondary | ICD-10-CM

## 2021-08-25 DIAGNOSIS — J020 Streptococcal pharyngitis: Secondary | ICD-10-CM | POA: Diagnosis present

## 2021-08-25 DIAGNOSIS — E669 Obesity, unspecified: Secondary | ICD-10-CM | POA: Diagnosis present

## 2021-08-25 DIAGNOSIS — E1165 Type 2 diabetes mellitus with hyperglycemia: Secondary | ICD-10-CM | POA: Diagnosis present

## 2021-08-25 DIAGNOSIS — Z87442 Personal history of urinary calculi: Secondary | ICD-10-CM

## 2021-08-25 DIAGNOSIS — Z91048 Other nonmedicinal substance allergy status: Secondary | ICD-10-CM

## 2021-08-25 DIAGNOSIS — J206 Acute bronchitis due to rhinovirus: Secondary | ICD-10-CM | POA: Diagnosis present

## 2021-08-25 DIAGNOSIS — Z90710 Acquired absence of both cervix and uterus: Secondary | ICD-10-CM

## 2021-08-25 DIAGNOSIS — E785 Hyperlipidemia, unspecified: Secondary | ICD-10-CM

## 2021-08-25 DIAGNOSIS — Z9049 Acquired absence of other specified parts of digestive tract: Secondary | ICD-10-CM

## 2021-08-25 DIAGNOSIS — K219 Gastro-esophageal reflux disease without esophagitis: Secondary | ICD-10-CM | POA: Diagnosis present

## 2021-08-25 DIAGNOSIS — Z7984 Long term (current) use of oral hypoglycemic drugs: Secondary | ICD-10-CM

## 2021-08-25 LAB — BASIC METABOLIC PANEL
Anion gap: 10 (ref 5–15)
BUN: 21 mg/dL — ABNORMAL HIGH (ref 6–20)
CO2: 24 mmol/L (ref 22–32)
Calcium: 10.1 mg/dL (ref 8.9–10.3)
Chloride: 102 mmol/L (ref 98–111)
Creatinine, Ser: 0.74 mg/dL (ref 0.44–1.00)
GFR, Estimated: >60 mL/min (ref >60)
Glucose, Bld: 188 mg/dL — ABNORMAL HIGH (ref 70–99)
Potassium: 4.5 mmol/L (ref 3.5–5.1)
Sodium: 136 mmol/L (ref 135–145)

## 2021-08-25 LAB — CBC WITH DIFFERENTIAL/PLATELET
Abs Immature Granulocytes: 0.02 10*3/uL (ref 0.00–0.07)
Basophils Absolute: 0 10*3/uL (ref 0.0–0.1)
Basophils Relative: 1 %
Eosinophils Absolute: 0.2 10*3/uL (ref 0.0–0.5)
Eosinophils Relative: 2 %
HCT: 43.4 % (ref 36.0–46.0)
Hemoglobin: 14.8 g/dL (ref 12.0–15.0)
Immature Granulocytes: 0 %
Lymphocytes Relative: 37 %
Lymphs Abs: 3 10*3/uL (ref 0.7–4.0)
MCH: 31.1 pg (ref 26.0–34.0)
MCHC: 34.1 g/dL (ref 30.0–36.0)
MCV: 91.2 fL (ref 80.0–100.0)
Monocytes Absolute: 0.5 10*3/uL (ref 0.1–1.0)
Monocytes Relative: 6 %
Neutro Abs: 4.4 10*3/uL (ref 1.7–7.7)
Neutrophils Relative %: 54 %
Platelets: 280 10*3/uL (ref 150–400)
RBC: 4.76 MIL/uL (ref 3.87–5.11)
RDW: 12.3 % (ref 11.5–15.5)
WBC: 8.2 10*3/uL (ref 4.0–10.5)
nRBC: 0 % (ref 0.0–0.2)

## 2021-08-25 LAB — RESP PANEL BY RT-PCR (FLU A&B, COVID) ARPGX2
Influenza A by PCR: NEGATIVE
Influenza B by PCR: NEGATIVE
SARS Coronavirus 2 by RT PCR: NEGATIVE

## 2021-08-25 MED ORDER — DEXAMETHASONE SODIUM PHOSPHATE 10 MG/ML IJ SOLN
10.0000 mg | Freq: Once | INTRAMUSCULAR | Status: AC
  Administered 2021-08-25: 10 mg via INTRAVENOUS
  Filled 2021-08-25: qty 1

## 2021-08-25 MED ORDER — ALBUTEROL SULFATE (2.5 MG/3ML) 0.083% IN NEBU
INHALATION_SOLUTION | RESPIRATORY_TRACT | Status: AC
  Filled 2021-08-25: qty 12

## 2021-08-25 MED ORDER — HYDROCODONE BIT-HOMATROP MBR 5-1.5 MG/5ML PO SOLN
5.0000 mL | ORAL | Status: AC
  Administered 2021-08-25: 5 mL via ORAL
  Filled 2021-08-25: qty 5

## 2021-08-25 MED ORDER — PRAVASTATIN SODIUM 20 MG PO TABS
10.0000 mg | ORAL_TABLET | Freq: Every day | ORAL | Status: DC
  Administered 2021-08-26 – 2021-08-27 (×2): 10 mg via ORAL
  Filled 2021-08-25 (×3): qty 1

## 2021-08-25 MED ORDER — PROMETHAZINE HCL 6.25 MG/5ML PO SYRP
6.2500 mg | ORAL_SOLUTION | ORAL | Status: DC | PRN
  Filled 2021-08-25: qty 5

## 2021-08-25 MED ORDER — PROMETHAZINE-DM 6.25-15 MG/5ML PO SYRP: 5.0000 mL | ORAL_SOLUTION | ORAL | Status: DC | PRN

## 2021-08-25 MED ORDER — ALBUTEROL SULFATE HFA 108 (90 BASE) MCG/ACT IN AERS
2.0000 | INHALATION_SPRAY | RESPIRATORY_TRACT | Status: DC | PRN
Start: 2021-08-25 — End: 2021-08-25

## 2021-08-25 MED ORDER — LOSARTAN POTASSIUM 50 MG PO TABS
25.0000 mg | ORAL_TABLET | Freq: Every day | ORAL | Status: DC
  Administered 2021-08-26 – 2021-08-27 (×2): 25 mg via ORAL
  Filled 2021-08-25 (×2): qty 1

## 2021-08-25 MED ORDER — UMECLIDINIUM BROMIDE 62.5 MCG/ACT IN AEPB
1.0000 | INHALATION_SPRAY | Freq: Every day | RESPIRATORY_TRACT | Status: DC
  Administered 2021-08-26 – 2021-08-28 (×3): 1 via RESPIRATORY_TRACT
  Filled 2021-08-25: qty 7

## 2021-08-25 MED ORDER — ENOXAPARIN SODIUM 40 MG/0.4ML IJ SOSY
40.0000 mg | PREFILLED_SYRINGE | INTRAMUSCULAR | Status: DC
  Administered 2021-08-27: 40 mg via SUBCUTANEOUS
  Filled 2021-08-25 (×2): qty 0.4

## 2021-08-25 MED ORDER — PREDNISONE 20 MG PO TABS
40.0000 mg | ORAL_TABLET | Freq: Every day | ORAL | Status: DC
  Administered 2021-08-26 – 2021-08-28 (×3): 40 mg via ORAL
  Filled 2021-08-25 (×3): qty 2

## 2021-08-25 MED ORDER — CLONAZEPAM 0.5 MG PO TABS: 0.5000 mg | ORAL_TABLET | Freq: Every day | ORAL | Status: DC | PRN

## 2021-08-25 MED ORDER — IPRATROPIUM-ALBUTEROL 0.5-2.5 (3) MG/3ML IN SOLN
3.0000 mL | Freq: Once | RESPIRATORY_TRACT | Status: AC
  Administered 2021-08-25: 3 mL via RESPIRATORY_TRACT
  Filled 2021-08-25: qty 3

## 2021-08-25 MED ORDER — DEXTROMETHORPHAN POLISTIREX ER 30 MG/5ML PO SUER
15.0000 mg | ORAL | Status: DC | PRN
  Administered 2021-08-26 (×2): 15 mg via ORAL
  Filled 2021-08-25 (×4): qty 5

## 2021-08-25 MED ORDER — HYDROXYZINE HCL 25 MG PO TABS
25.0000 mg | ORAL_TABLET | Freq: Every day | ORAL | Status: DC
  Administered 2021-08-26 – 2021-08-27 (×3): 25 mg via ORAL
  Filled 2021-08-25 (×3): qty 1

## 2021-08-25 MED ORDER — SODIUM CHLORIDE 0.9 % IV SOLN
1.0000 g | INTRAVENOUS | Status: DC
  Administered 2021-08-26 – 2021-08-27 (×2): 1 g via INTRAVENOUS
  Filled 2021-08-25 (×2): qty 10

## 2021-08-25 MED ORDER — MOMETASONE FURO-FORMOTEROL FUM 200-5 MCG/ACT IN AERO
2.0000 | INHALATION_SPRAY | Freq: Two times a day (BID) | RESPIRATORY_TRACT | Status: DC
  Administered 2021-08-26 – 2021-08-28 (×5): 2 via RESPIRATORY_TRACT
  Filled 2021-08-25: qty 8.8

## 2021-08-25 MED ORDER — IPRATROPIUM BROMIDE 0.02 % IN SOLN: 0.5000 mg | Freq: Once | RESPIRATORY_TRACT | Status: DC

## 2021-08-25 MED ORDER — METFORMIN HCL 500 MG PO TABS
1000.0000 mg | ORAL_TABLET | Freq: Two times a day (BID) | ORAL | Status: DC
  Administered 2021-08-26 – 2021-08-28 (×5): 1000 mg via ORAL
  Filled 2021-08-25 (×5): qty 2

## 2021-08-25 MED ORDER — CEFTRIAXONE SODIUM 1 G IJ SOLR
1.0000 g | Freq: Once | INTRAMUSCULAR | Status: AC
  Administered 2021-08-25: 1 g via INTRAVENOUS
  Filled 2021-08-25: qty 10

## 2021-08-25 MED ORDER — ALBUTEROL SULFATE (2.5 MG/3ML) 0.083% IN NEBU: 10.0000 mg/h | INHALATION_SOLUTION | Freq: Once | RESPIRATORY_TRACT | Status: DC

## 2021-08-25 MED ORDER — ALBUTEROL SULFATE (2.5 MG/3ML) 0.083% IN NEBU
2.5000 mg | INHALATION_SOLUTION | RESPIRATORY_TRACT | Status: DC | PRN
  Administered 2021-08-26 (×2): 2.5 mg via RESPIRATORY_TRACT
  Filled 2021-08-25 (×3): qty 3

## 2021-08-25 MED ORDER — NICOTINE 14 MG/24HR TD PT24
14.0000 mg | MEDICATED_PATCH | Freq: Every day | TRANSDERMAL | Status: DC
Start: 2021-08-26 — End: 2021-08-28
  Filled 2021-08-25 (×2): qty 1

## 2021-08-25 MED ORDER — AZITHROMYCIN 500 MG IV SOLR
500.0000 mg | Freq: Once | INTRAVENOUS | Status: AC
Start: 2021-08-25 — End: 2021-08-26
  Administered 2021-08-26: 500 mg via INTRAVENOUS
  Filled 2021-08-25: qty 5

## 2021-08-25 MED ORDER — INSULIN ASPART 100 UNIT/ML IJ SOLN
0.0000 [IU] | Freq: Three times a day (TID) | INTRAMUSCULAR | Status: DC
  Administered 2021-08-26 – 2021-08-27 (×4): 8 [IU] via SUBCUTANEOUS
  Administered 2021-08-27: 3 [IU] via SUBCUTANEOUS
  Administered 2021-08-27: 2 [IU] via SUBCUTANEOUS
  Filled 2021-08-25: qty 0.15

## 2021-08-25 MED ORDER — GLIPIZIDE ER 5 MG PO TB24
10.0000 mg | ORAL_TABLET | Freq: Two times a day (BID) | ORAL | Status: DC
  Administered 2021-08-26 – 2021-08-28 (×5): 10 mg via ORAL
  Filled 2021-08-25 (×5): qty 2
  Filled 2021-08-25: qty 1

## 2021-08-25 MED ORDER — ALBUTEROL SULFATE (2.5 MG/3ML) 0.083% IN NEBU
10.0000 mg | INHALATION_SOLUTION | Freq: Once | RESPIRATORY_TRACT | Status: AC
  Administered 2021-08-25: 10 mg via RESPIRATORY_TRACT

## 2021-08-25 MED ORDER — VENLAFAXINE HCL ER 150 MG PO CP24
150.0000 mg | ORAL_CAPSULE | Freq: Every day | ORAL | Status: DC
  Administered 2021-08-26 – 2021-08-27 (×2): 150 mg via ORAL
  Filled 2021-08-25 (×4): qty 1

## 2021-08-25 MED ORDER — AMITRIPTYLINE HCL 25 MG PO TABS
25.0000 mg | ORAL_TABLET | Freq: Every day | ORAL | Status: DC
  Administered 2021-08-26 – 2021-08-27 (×3): 25 mg via ORAL
  Filled 2021-08-25 (×3): qty 1

## 2021-08-25 MED ORDER — LORAZEPAM 2 MG/ML IJ SOLN
0.5000 mg | Freq: Once | INTRAMUSCULAR | Status: AC
  Administered 2021-08-25: 0.5 mg via INTRAVENOUS
  Filled 2021-08-25: qty 1

## 2021-08-25 NOTE — ED Provider Notes (Signed)
?[facility]-EMERGENCY DEPT ?Provider Note ? ? ?CSN: [id number] ?Arrival date & time: 08/25/21  1731 ? ?  ? ?History ? ?Chief Complaint  ?Patient presents with  ? Shortness of Breath  ? Wheezing  ? ? ?Barbara Wu is a 51 y.o. female with medical history significant for diabetes, GERD, arthritis.  Patient presents ED for evaluation of shortness of breath and wheezing.  Patient states that she was recently seen at outside clinic on 3/29.  Per provider note, "Patient presents with complaints of nasal congestion cough, sinus drainage, URI symptoms for about a week.  Patient complains of fever, sore throat and also coughing so much that her back is hurting her now.".  Patient states at this time, she was diagnosed with strep pharyngitis as well as told that she "was developing a pneumonia".  Provider at urgent care provided her with amoxicillin prescription, promethazine prescription, albuterol inhaler, steroids.  Patient states that she has not noticed any significant improvement while on medications which prompted her to present to ED tonight.  Patient states she is a current smoker however denies any history of COPD.  Patient endorsing shortness of breath, wheezing, cough, nasal congestion.  Patient denies nausea, vomiting, diarrhea, abdominal pain, fevers. ? ? ?Shortness of Breath ?Associated symptoms: cough and wheezing   ?Associated symptoms: no abdominal pain, no fever and no vomiting   ?Wheezing ?Associated symptoms: cough and shortness of breath   ?Associated symptoms: no fever   ? ?  ? ?Home Medications ?Prior to Admission medications   ?Medication Sig Start Date End Date Taking? Authorizing Provider  ?amitriptyline (ELAVIL) 25 MG tablet Take 25 mg by mouth at bedtime.    [provider]  ?docusate sodium (PHILLIPS STOOL SOFTENER) 100 MG capsule Take 100 mg by mouth daily as needed for mild constipation.    [provider]  ?FLUoxetine (PROZAC) 20 MG capsule Take 20 mg by  mouth daily.  02/11/17 02/11/18  [provider]  ?HYDROcodone-acetaminophen (NORCO) 5-325 MG tablet Take 1 tablet by mouth every 6 (six) hours as needed for moderate pain. 06/26/21   Franne Forts, DO  ?hydrOXYzine (ATARAX/VISTARIL) 25 MG tablet Take 25 mg by mouth at bedtime.    [provider]  ?ibuprofen (ADVIL) 800 MG tablet Take 1 tablet (800 mg total) by mouth 3 (three) times daily. 06/26/21   Franne Forts, DO  ?ibuprofen (ADVIL,MOTRIN) 200 MG tablet Take 200 mg by mouth every 6 (six) hours as needed for moderate pain.    [provider]  ?ondansetron (ZOFRAN) 4 MG tablet Take 1 tablet (4 mg total) by mouth every 6 (six) hours. 01/21/21   Curatolo, Adam, DO  ?oxyCODONE (ROXICODONE) 5 MG immediate release tablet Take 1 tablet (5 mg total) by mouth every 6 (six) hours as needed for up to 15 doses for severe pain. 01/21/21   Virgina Norfolk, DO  ?oxyCODONE-acetaminophen (PERCOCET) 5-325 MG tablet Take 1 tablet by mouth every 4 (four) hours as needed. 07/18/17   Pisciotta, Joni Reining, PA-C  ?tamsulosin (FLOMAX) 0.4 MG CAPS capsule Take 1 capsule (0.4 mg total) by mouth daily. 07/15/17   Dione Booze, MD  ?   ? ?Allergies    ?Demerol, Morphine and related, Other, Tape, and Meperidine   ? ?Review of Systems   ?Review of Systems  ?Constitutional:  Negative for fever.  ?HENT:  Positive for congestion.   ?Respiratory:  Positive for cough, shortness of breath and wheezing.   ?Gastrointestinal:  Negative for abdominal  pain, diarrhea, nausea and vomiting.  ?All other systems reviewed and are negative. ? ?Physical Exam ?Updated Vital Signs ?BP (!) 132/98   Pulse 85   Temp 98.2 ?F (36.8 ?C) (Oral)   Resp 17   SpO2 95%  ?Physical Exam ?Vitals and nursing note reviewed.  ?Constitutional:   ?   General: She is not in acute distress. ?   Appearance: She is well-developed. She is ill-appearing. She is not toxic-appearing or diaphoretic.  ?HENT:  ?   Head: Normocephalic and atraumatic.  ?   Nose: Congestion  present.  ?   Mouth/Throat:  ?   Mouth: Mucous membranes are moist.  ?   Pharynx: Pharyngeal swelling present. No oropharyngeal exudate.  ?   Tonsils: No tonsillar exudate or tonsillar abscesses. 1+ on the right. 1+ on the left.  ?Eyes:  ?   Extraocular Movements: Extraocular movements intact.  ?   Conjunctiva/sclera: Conjunctivae normal.  ?   Pupils: Pupils are equal, round, and reactive to light.  ?Neck:  ?   Vascular: No JVD.  ?Cardiovascular:  ?   Rate and Rhythm: Normal rate and regular rhythm.  ?Pulmonary:  ?   Effort: Pulmonary effort is normal. No tachypnea, accessory muscle usage or respiratory distress.  ?   Breath sounds: Examination of the right-upper field reveals wheezing. Examination of the left-upper field reveals wheezing. Examination of the right-middle field reveals wheezing. Examination of the left-middle field reveals wheezing. Wheezing present.  ?Abdominal:  ?   General: Abdomen is flat. Bowel sounds are normal.  ?   Palpations: Abdomen is soft.  ?   Tenderness: There is no abdominal tenderness.  ?Musculoskeletal:  ?   Cervical back: Normal range of motion and neck supple.  ?   Right lower leg: No tenderness. No edema.  ?   Left lower leg: No tenderness. No edema.  ?Skin: ?   General: Skin is warm and dry.  ?   Capillary Refill: Capillary refill takes less than 2 seconds.  ?Neurological:  ?   Mental Status: She is alert and oriented to person, place, and time.  ? ? ?ED Results / Procedures / Treatments   ?Labs ?(all labs ordered are listed, but only abnormal results are displayed) ?Labs Reviewed  ?BASIC METABOLIC PANEL - Abnormal; Notable for the following components:  ?    Result Value  ? Glucose, Bld 188 (*)   ? BUN 21 (*)   ? All other components within normal limits  ?RESP PANEL BY RT-PCR (FLU A&B, COVID) [id number]  ?CBC WITH DIFFERENTIAL/PLATELET  ? ? ?EKG ?None ? ?Radiology ?DG Chest 2 View ? ?Result Date: 08/25/2021 ?CLINICAL DATA:  Shortness of breath EXAM: CHEST - 2 VIEW COMPARISON:  July 31, 2014 FINDINGS: The heart, hila, and mediastinum are normal. No pneumothorax. Mild bibasilar opacities, right greater than left. No overt edema. No other acute abnormalities. IMPRESSION: Subtle hazy opacities in the bases, right greater than left, could represent developing infiltrate. Recommend short-term follow-up imaging to ensure resolution. Electronically Signed   By: Gerome Sam III M.D.   On: 08/25/2021 18:06   ? ?Procedures ?Procedures  ? ? ?Medications Ordered in ED ?Medications  ?albuterol (PROVENTIL) (2.5 MG/3ML) 0.083% nebulizer solution (  Not Given 08/25/21 2058)  ?ipratropium-albuterol (DUONEB) 0.5-2.5 (3) MG/3ML nebulizer solution 3 mL (3 mLs Nebulization Given 08/25/21 1857)  ?dexamethasone (DECADRON) injection 10 mg (10 mg Intravenous Given 08/25/21 1849)  ?HYDROcodone bit-homatropine (HYCODAN) 5-1.5 MG/5ML syrup 5 mL (5 mLs Oral Given  08/25/21 1950)  ?ipratropium-albuterol (DUONEB) 0.5-2.5 (3) MG/3ML nebulizer solution 3 mL (3 mLs Nebulization Given 08/25/21 2039)  ?albuterol (PROVENTIL) (2.5 MG/3ML) 0.083% nebulizer solution 10 mg (10 mg Nebulization Given 08/25/21 2054)  ? ? ?ED Course/ Medical Decision Making/ A&P ?  ?                        ?Medical Decision Making ?Amount and/or Complexity of Data Reviewed ?Labs: ordered. ?Radiology: ordered. ? ?Risk ?Prescription drug management. ? ? ?51 year old female presents ED for evaluation of shortness of breath and wheezing.  Please see HPI for further details. ? ?On examination, the patient is afebrile, nontachycardic, nonhypoxic.  Patient lung sounds show diffuse wheezing bilaterally to upper and middle lobes.  Patient abdomen soft compressible all 4 quadrants.  Patient posterior oropharynx is erythematous, 1+ swollen tonsils bilaterally, no exudates noted.  Patient handling secretions appropriately, no drooling, no trismus, no signs of RPA or PTA. ? ?Patient worked up utilizing the following labs and imaging studies interpreted by me personally: ?- BMP  shows elevated glucose, noncontributory to symptoms ?- CBC unremarkable ?- Respiratory panel pending ?- Chest x-ray shows subtle hazy opacities in the base of lung, right greater than left. ? ?Patient trea

## 2021-08-25 NOTE — Assessment & Plan Note (Addendum)
Continue metformin and glipizide

## 2021-08-25 NOTE — ED Triage Notes (Signed)
Pt c/o wheezing, cough, SHOB since Monday. Pt was diagnosed with pneumonia. Pt reports taking all meds w/ no relief.  ?

## 2021-08-25 NOTE — ED Provider Notes (Signed)
I provided a substantive portion of the care of this patient.  I personally performed the entirety of the medical decision making for this encounter. ? ?   ? ?Consult:Dr.Gardner for admission ? ?Evaluate the patient reviewed all of her history.  I performed physical examination.  Patient had coarse wheezing and increased work of breathing after 2 DuoNeb treatments. ? ?I have reassessed the patient after third nebulizer treatment.  She continues to have coarse wheezing.  Patient does have smoking history.  He has had outpatient treatment with amoxicillin and bronchodilators including nebulized therapy.  At this time patient has failed outpatient therapy.  She does have suggestion of infiltrates on chest x-ray.  Will treat for community-acquired pneumonia and add azithromycin.  Plan for admission. ?  ?Arby Barrette, MD ?08/26/21 1319 ? ?

## 2021-08-25 NOTE — ED Notes (Signed)
Hospitalist at the bedside at this time ?

## 2021-08-25 NOTE — Assessment & Plan Note (Addendum)
Completed course of antibiotics. 

## 2021-08-25 NOTE — H&P (Signed)
?History and Physical  ? ? ?Patient: Barbara Wu [id number] DOB: 05-Oct-1970 ?DOA: 08/25/2021 ?DOS: the patient was seen and examined on 08/25/2021 ?PCP: Barbara Lawrence, NP  ?Patient coming from: Home ? ?Chief Complaint:  ?Chief Complaint  ?Patient presents with  ? Shortness of Breath  ? Wheezing  ? ?HPI: Barbara Wu is a 51 y.o. female with medical history significant of DM2. ? ?Pt with 20 PY h/o smoking. ? ?Pt with bronchitis episode ~6 months ago. ? ?Pt presents to ED with SOB, wheezing, sore throat, URI symptoms. ? ?Symptoms onset ~1 week ago after baby party shower.  9 people came down with Strep throat.  But pt also has respiratory symptoms. ? ?Strep test positive in PCP office on 3/29 -> pt started on amoxicillin. ? ?Respiratory symptoms have persisted / worsened. ? ?In to ED today with severe wheezing, SOB. ?  ?Review of Systems: As mentioned in the history of present illness. All other systems reviewed and are negative. ?Past Medical History:  ?Diagnosis Date  ? Arthritis   ? Biliary dyskinesia   ? Diabetes mellitus without complication ([place])   ? diet controlled  ? Fibromyalgia   ? GERD (gastroesophageal reflux disease)   ? History of kidney stones   ? Interstitial cystitis   ? PONV (postoperative nausea and vomiting)   ? PONV (postoperative nausea and vomiting)   ? Renal disorder   ? RSD (reflex sympathetic dystrophy)   ? ?Past Surgical History:  ?Procedure Laterality Date  ? ABDOMINAL HYSTERECTOMY    ? ANTERIOR CRUCIATE LIGAMENT REPAIR    ? CARPAL TUNNEL RELEASE Bilateral   ? CESAREAN SECTION    ? CHOLECYSTECTOMY N/A 01/25/2014  ? Procedure: LAPAROSCOPIC CHOLECYSTECTOMY;  Surgeon: Coralie Keens, MD;  Location: [facility];  Service: General;  Laterality: N/A;  ? CYSTOSCOPY W/ URETERAL STENT PLACEMENT Right 11/23/2015  ? Procedure: [facility];  Surgeon: Cleon Gustin, MD;  Location: [facility];  Service: Urology;  Laterality:  Right;  ? HIP ARTHROSCOPY Left 10/28/2012  ? Procedure: ARTHROSCOPY HIP- left;  Surgeon: Ninetta Lights, MD;  Location: [facility];  Service: Orthopedics;  Laterality: Left;  ? Barrie Lyme IMPLANT PLACEMENT  Feb 2010  ? bladder, Medtronic, serial # [id number], model [id number], info # [phone]  ? TONSILLECTOMY    ? ?Social History:  reports that she has been smoking cigarettes. She has a 20.00 pack-year smoking history. She has never used smokeless tobacco. She reports that she does not drink alcohol and does not use drugs. ? ?Allergies  ?Allergen Reactions  ? Demerol Hives  ? Morphine And Related Hives  ? Other Hives  ?  Eggplant allergy  ? Tape Other (See Comments)  ?  Band-aid (leaves a burn spot on skin)  ? Meperidine Other (See Comments)  ?  HIVES  ? ? ?Family History  ?Problem Relation Age of Onset  ? Seizures Unknown   ? Stroke Unknown   ? ? ?Prior to Admission medications   ?Medication Sig Start Date End Date Taking? Authorizing Provider  ?albuterol (PROVENTIL) (2.5 MG/3ML) 0.083% nebulizer solution Take 2.5 mg by nebulization every 4 (four) hours as needed for wheezing or shortness of breath. 08/22/21  Yes [provider]  ?albuterol (VENTOLIN HFA) 108 (90 Base) MCG/ACT inhaler Inhale 2 puffs into the lungs every 6 (six) hours as needed for wheezing or shortness of breath. 08/22/21  Yes [provider]  ?amitriptyline (ELAVIL) 25 MG tablet Take 25 mg  by mouth at bedtime.   Yes [provider]  ?amoxicillin (AMOXIL) 875 MG tablet Take 875 mg by mouth 2 (two) times daily. 08/22/21  Yes [provider]  ?clonazePAM (KLONOPIN) 0.5 MG tablet Take 0.5 mg by mouth daily as needed for anxiety. 07/25/21  Yes [provider]  ?glipiZIDE (GLUCOTROL XL) 10 MG 24 hr tablet Take 10 mg by mouth 2 (two) times daily. 07/25/21  Yes [provider]  ?hydrOXYzine (ATARAX/VISTARIL) 25 MG tablet Take 25 mg by mouth at bedtime.   Yes [provider]  ?losartan (COZAAR) 25 MG tablet  Take 25 mg by mouth daily. 07/25/21  Yes [provider]  ?metFORMIN (GLUCOPHAGE) 1000 MG tablet Take 1,000 mg by mouth 2 (two) times daily. 07/25/21  Yes [provider]  ?pravastatin (PRAVACHOL) 10 MG tablet Take 10 mg by mouth daily. 07/25/21  Yes [provider]  ?promethazine-dextromethorphan (PROMETHAZINE-DM) 6.25-15 MG/5ML syrup Take 5 mLs by mouth every 4 (four) hours as needed for cough. 08/22/21  Yes [provider]  ?venlafaxine XR (EFFEXOR-XR) 150 MG 24 hr capsule Take 150 mg by mouth daily. 07/25/21  Yes [provider]  ? ? ?Physical Exam: ?Vitals:  ? 08/25/21 2200 08/25/21 2215 08/25/21 2230 08/25/21 2245  ?BP: (!) 166/82 (!) 161/92 (!) 161/90 (!) 144/79  ?Pulse: (!) 102 99 (!) 105 (!) 102  ?Resp: (!) 27 (!) 29 (!) 24 20  ?Temp:      ?TempSrc:      ?SpO2: 94% 93% 95% 96%  ? ?Constitutional: NAD, calm, comfortable ?Eyes: PERRL, lids and conjunctivae normal ?ENMT: Mucous membranes are moist. Posterior pharynx clear of any exudate or lesions.Normal dentition.  ?Neck: normal, supple, no masses, no thyromegaly ?Respiratory: Diffuse wheezing ?Cardiovascular: Mild tachycardia ?Abdomen: no tenderness, no masses palpated. No hepatosplenomegaly. Bowel sounds positive.  ?Musculoskeletal: no clubbing / cyanosis. No joint deformity upper and lower extremities. Good ROM, no contractures. Normal muscle tone.  ?Skin: no rashes, lesions, ulcers. No induration ?Neurologic: CN 2-12 grossly intact. Sensation intact, DTR normal. Strength 5/5 in all 4.  ?Psychiatric: Normal judgment and insight. Alert and oriented x 3. Normal mood.  ? ?Data Reviewed: ? ?CXR with bibasilar haziness. ?WBC nl ?COVID and flu neg ? ?Assessment and Plan: ?* COPD with acute exacerbation ([place]) ?Acute bronchitis today; HPI concerning for undiagnosed underlying COPD. ?COPD pathway ?COVID and flu Neg ?check RVP ?PRN and scheduled nebs ?Prednisone ?Rocephin ?For both COPD exacerbation, and positive rapid strep test  on 3/29 at office visit. ?Advised smoking cessation ?Nicotine patch ?Check A1AT ? ?Strep throat ?On rocephin as above ? ?Diabetes mellitus without complication ([place]) ?Cont home metformin + Glipizide ?Add mod scale SSI AC in setting of steroids ?Cont low dose losartan ? ? ? ? ? Advance Care Planning:   Code Status: Full Code ? ?Consults: None ? ?Family Communication: Husband at bedside ? ?Severity of Illness: ?The appropriate patient status for this patient is INPATIENT. Inpatient status is judged to be reasonable and necessary in order to provide the required intensity of service to ensure the patient's safety. The patient's presenting symptoms, physical exam findings, and initial radiographic and laboratory data in the context of their chronic comorbidities is felt to place them at high risk for further clinical deterioration. Furthermore, it is not anticipated that the patient will be medically stable for discharge from the hospital within 2 midnights of admission.  ? ?* I certify that at the point of admission it is my clinical judgment that the  patient will require inpatient hospital care spanning beyond 2 midnights from the point of admission due to high intensity of service, high risk for further deterioration and high frequency of surveillance required.* ? ?Author: ?Etta Quill., DO ?08/25/2021 11:38 PM ? ?For on call review [email or web address].  ?

## 2021-08-25 NOTE — Assessment & Plan Note (Addendum)
Patient presented with cough, shortness of breath, sore throat. ?Influenza, COVID-negative. RVP : rhinovirus ?Symptoms consistent with bronchitis with acute COPD exacerbation. ?Continue scheduled and as needed nebulized bronchodilators. ?Completed prednisone for 4 days. ?Completed ceftriaxone for bronchitis. ?Patient remains on room oxygen, not hypoxic. ?Advised smoking cessation. ?Alpha 1 antitrypsin 128 which is normal ? ?

## 2021-08-26 DIAGNOSIS — J441 Chronic obstructive pulmonary disease with (acute) exacerbation: Secondary | ICD-10-CM | POA: Diagnosis not present

## 2021-08-26 DIAGNOSIS — I10 Essential (primary) hypertension: Secondary | ICD-10-CM

## 2021-08-26 DIAGNOSIS — E785 Hyperlipidemia, unspecified: Secondary | ICD-10-CM

## 2021-08-26 LAB — GLUCOSE, CAPILLARY
Glucose-Capillary: 278 mg/dL — ABNORMAL HIGH (ref 70–99)
Glucose-Capillary: 272 mg/dL — ABNORMAL HIGH (ref 70–99)

## 2021-08-26 LAB — CBC
HCT: 40.4 % (ref 36.0–46.0)
Hemoglobin: 13.8 g/dL (ref 12.0–15.0)
MCH: 31.2 pg (ref 26.0–34.0)
MCHC: 34.2 g/dL (ref 30.0–36.0)
MCV: 91.4 fL (ref 80.0–100.0)
Platelets: 264 10*3/uL (ref 150–400)
RBC: 4.42 MIL/uL (ref 3.87–5.11)
RDW: 12.5 % (ref 11.5–15.5)
WBC: 7.6 10*3/uL (ref 4.0–10.5)
nRBC: 0 % (ref 0.0–0.2)

## 2021-08-26 LAB — BASIC METABOLIC PANEL
Anion gap: 11 (ref 5–15)
BUN: 23 mg/dL — ABNORMAL HIGH (ref 6–20)
CO2: 21 mmol/L — ABNORMAL LOW (ref 22–32)
Calcium: 9.9 mg/dL (ref 8.9–10.3)
Chloride: 104 mmol/L (ref 98–111)
Creatinine, Ser: 0.79 mg/dL (ref 0.44–1.00)
GFR, Estimated: >60 mL/min (ref >60)
Glucose, Bld: 344 mg/dL — ABNORMAL HIGH (ref 70–99)
Potassium: 4.8 mmol/L (ref 3.5–5.1)
Sodium: 136 mmol/L (ref 135–145)

## 2021-08-26 LAB — RESPIRATORY PANEL BY PCR

## 2021-08-26 LAB — HIV ANTIBODY (ROUTINE TESTING W REFLEX): HIV Screen 4th Generation wRfx: NONREACTIVE

## 2021-08-26 LAB — HEMOGLOBIN A1C
Hgb A1c MFr Bld: 8.3 % — ABNORMAL HIGH (ref 4.8–5.6)
Mean Plasma Glucose: 191.51 mg/dL

## 2021-08-26 MED ORDER — BENZONATATE 100 MG PO CAPS
100.0000 mg | ORAL_CAPSULE | Freq: Three times a day (TID) | ORAL | Status: DC
  Administered 2021-08-26 – 2021-08-27 (×4): 100 mg via ORAL
  Filled 2021-08-26 (×4): qty 1

## 2021-08-26 MED ORDER — ALBUTEROL SULFATE (2.5 MG/3ML) 0.083% IN NEBU
2.5000 mg | INHALATION_SOLUTION | Freq: Four times a day (QID) | RESPIRATORY_TRACT | Status: DC
  Administered 2021-08-27 (×4): 2.5 mg via RESPIRATORY_TRACT
  Filled 2021-08-26 (×4): qty 3

## 2021-08-26 MED ORDER — ALBUTEROL SULFATE (2.5 MG/3ML) 0.083% IN NEBU
2.5000 mg | INHALATION_SOLUTION | Freq: Three times a day (TID) | RESPIRATORY_TRACT | Status: DC
  Administered 2021-08-26: 2.5 mg via RESPIRATORY_TRACT
  Filled 2021-08-26: qty 3

## 2021-08-26 MED ORDER — ACETAMINOPHEN 325 MG PO TABS
650.0000 mg | ORAL_TABLET | Freq: Four times a day (QID) | ORAL | Status: DC | PRN
  Administered 2021-08-26 – 2021-08-27 (×2): 650 mg via ORAL
  Filled 2021-08-26 (×2): qty 2

## 2021-08-26 NOTE — Progress Notes (Signed)
?  Progress Note ? ? ?Patient: Barbara Wu FYB:[id number] DOB: [birth year] DOA: 08/25/2021     1 ? ?DOS: the patient was seen and examined on 08/26/2021 ?  ?Brief hospital course: ?This 51 years old female with PMH significant for type 2 diabetes, tobacco use presented in the ED with complaints of cough,  sore throat, wheezing and shortness of breath.  Patient reported all her symptoms started after she went to a baby shower party few days back. 9 peoples came down with a strep throat, she tested positive in PCPs office on 3/29 and was started on amoxicillin.  She has been taking amoxicillin but reports her respiratory symptoms did not get better.  She continues to have cough with shortness of breath.  Patient is admitted for possible bronchitis with acute COPD exacerbation. ? ?Assessment and Plan: ?* COPD with acute exacerbation ([facility]) ?Patient presented with cough, shortness of breath, sore throat. ?Influenza, COVID-negative. RVP pending. ?Symptoms consistent with bronchitis with acute COPD exacerbation. ?Continue scheduled and as needed nebulized bronchodilators. ?Continue prednisone. ?Continue ceftriaxone for bronchitis. ?Patient remains on room oxygen, not hypoxic. ?Advised smoking cessation. ?Check alpha-1 antitrypsin. ? ? ?Hyperlipidemia ?Continue pravastatin. ? ?Essential hypertension ?Continue losartan. ? ?Strep throat ?Continue ceftriaxone. ? ?Diabetes mellitus without complication (HCC) ?Continue metformin and glipizide. ?Add mod scale SSI AC in setting of steroids ? ? ? ?Subjective: Patient was seen and examined at bedside.  Overnight events noted. ?Patient reports feeling better but reports short of breath while walking. ? ?Physical Exam: ?Vitals:  ? 08/26/21 0600 08/26/21 0920 08/26/21 1206 08/26/21 1402  ?BP:  124/74  135/70  ?Pulse:  86  92  ?Resp:  14  20  ?Temp:  99.1 ?F (37.3 ?C)    ?TempSrc:  Oral    ?SpO2: 97% 98% 97% 94%  ?Weight:      ?Height:      ? ?General exam: Appears comfortable, not in any  acute distress. ?Respiratory system: CTA bilaterally, no wheezing, no crackles, normal respiratory effort. ?Cardiovascular system: S1-S2 heard, regular rate and rhythm, no murmur. ?Gastrointestinal system: Abdomen is soft, non tender, non distended, BS+ ?Central nervous system: Alert, oriented x3, no focal neurological deficits. ?Extremities: No edema, no cyanosis, no clubbing. ?Psychiatry: Mood, insight, judgment normal. ? ? ?Data Reviewed: ?I have Reviewed nursing notes, Vitals, and Lab results since pt's last encounter. Pertinent lab results CBC, BMP ?I have ordered test including CBC, BMP ?I have reviewed the last note from hospitalist,  ?I have discussed pt's care plan and test results with patient.  ? ?Family Communication: No family at bedside ? ?Disposition: ?Status is: Observation ?The patient remains OBS appropriate and will d/c before 2 midnights. ? ?Admitted for acute bronchitis with COPD exacerbation requiring bronchodilators and prednisone. ? ?Anticipated discharge home in 1 to 2 days. ? ? Planned Discharge Destination: Home ? ? ? ?Time spent: 50 minutes ? ?Author: ?Cipriano Bunker, MD ?08/26/2021 2:16 PM ? ?For on call review [email or web address].  ?

## 2021-08-26 NOTE — Progress Notes (Signed)
PT Cancellation Note ? ?Patient Details ?Name: Barbara Wu ?MRN: [id number] ?DOB: 1970/08/27 ? ? ?Cancelled Treatment:     PT order received but eval deferred.  Pt reports IND with mobility and RN concurs.  NO PT needs at this time and PT service will sign off. ? ? ?Nilton Lave ?08/26/2021, 8:02 AM ?

## 2021-08-26 NOTE — Hospital Course (Addendum)
This 51 years old female with PMH significant for type 2 diabetes, tobacco use presented in the ED with complaints of cough, sore throat, wheezing and shortness of breath.  Patient reported all her symptoms started after she went to a baby shower party few days back. 9 peoples came down with a strep throat, she herself tested positive in PCPs office on 3/29 and was started on amoxicillin. She has been taking amoxicillin but reports her respiratory symptoms did not get better.  She continues to have cough with shortness of breath.   ?Patient was admitted for possible bronchitis with acute COPD exacerbation.  Patient was started on a scheduled and as needed bronchodilators, she is also started on prednisone and continued on ceftriaxone.  She continues to get better, she was started on Hycodan for severe cough, she was able to ambulate without any difficulty breathing.  Patient felt much better, improved, completed antibiotics for 5 days.  Patient is being discharged home. ?

## 2021-08-26 NOTE — Progress Notes (Signed)
O2 saturation resting on bed in room air -94%.  ?O2 sats while ambulating-97% in room air ?

## 2021-08-26 NOTE — Progress Notes (Signed)
Patient c/o SOB, O2 on room air 94%. PRN Albuterol given. ?

## 2021-08-26 NOTE — Assessment & Plan Note (Signed)
Continue pravastatin 

## 2021-08-26 NOTE — Plan of Care (Signed)
  Problem: Education: Goal: Knowledge of disease or condition will improve Outcome: Progressing   Problem: Activity: Goal: Ability to tolerate increased activity will improve Outcome: Progressing   

## 2021-08-26 NOTE — Assessment & Plan Note (Signed)
Continue losartan. 

## 2021-08-26 NOTE — Progress Notes (Signed)
OT Cancellation Note ? ?Patient Details ?Name: Barbara Wu ?MRN: [id number] ?DOB: [birth year] ? ? ?Cancelled Treatment:    Reason Eval/Treat Not Completed: OT screened, no needs identified, will sign off ?Patient reports being independent in ADLs with nursing confirming reports. OT to sign off at this time.  ?Bich Mchaney OTR/L, MS ?Acute Rehabilitation Department ?Office# [phone] ?Pager# [phone] ? ? ?08/26/2021, 8:05 AM ?

## 2021-08-27 DIAGNOSIS — J441 Chronic obstructive pulmonary disease with (acute) exacerbation: Secondary | ICD-10-CM | POA: Diagnosis present

## 2021-08-27 DIAGNOSIS — R0602 Shortness of breath: Secondary | ICD-10-CM | POA: Diagnosis present

## 2021-08-27 DIAGNOSIS — Z87442 Personal history of urinary calculi: Secondary | ICD-10-CM | POA: Diagnosis not present

## 2021-08-27 DIAGNOSIS — E1165 Type 2 diabetes mellitus with hyperglycemia: Secondary | ICD-10-CM | POA: Diagnosis present

## 2021-08-27 DIAGNOSIS — K219 Gastro-esophageal reflux disease without esophagitis: Secondary | ICD-10-CM | POA: Diagnosis present

## 2021-08-27 DIAGNOSIS — Z9049 Acquired absence of other specified parts of digestive tract: Secondary | ICD-10-CM | POA: Diagnosis not present

## 2021-08-27 DIAGNOSIS — J020 Streptococcal pharyngitis: Secondary | ICD-10-CM | POA: Diagnosis present

## 2021-08-27 DIAGNOSIS — Z91018 Allergy to other foods: Secondary | ICD-10-CM | POA: Diagnosis not present

## 2021-08-27 DIAGNOSIS — Z91048 Other nonmedicinal substance allergy status: Secondary | ICD-10-CM | POA: Diagnosis not present

## 2021-08-27 DIAGNOSIS — E785 Hyperlipidemia, unspecified: Secondary | ICD-10-CM | POA: Diagnosis present

## 2021-08-27 DIAGNOSIS — E669 Obesity, unspecified: Secondary | ICD-10-CM | POA: Diagnosis present

## 2021-08-27 DIAGNOSIS — Z885 Allergy status to narcotic agent status: Secondary | ICD-10-CM | POA: Diagnosis not present

## 2021-08-27 DIAGNOSIS — Z20822 Contact with and (suspected) exposure to covid-19: Secondary | ICD-10-CM | POA: Diagnosis present

## 2021-08-27 DIAGNOSIS — T380X5A Adverse effect of glucocorticoids and synthetic analogues, initial encounter: Secondary | ICD-10-CM | POA: Diagnosis not present

## 2021-08-27 DIAGNOSIS — M797 Fibromyalgia: Secondary | ICD-10-CM | POA: Diagnosis present

## 2021-08-27 DIAGNOSIS — Z7984 Long term (current) use of oral hypoglycemic drugs: Secondary | ICD-10-CM | POA: Diagnosis not present

## 2021-08-27 DIAGNOSIS — J206 Acute bronchitis due to rhinovirus: Secondary | ICD-10-CM | POA: Diagnosis present

## 2021-08-27 DIAGNOSIS — Z79899 Other long term (current) drug therapy: Secondary | ICD-10-CM | POA: Diagnosis not present

## 2021-08-27 DIAGNOSIS — Z888 Allergy status to other drugs, medicaments and biological substances status: Secondary | ICD-10-CM | POA: Diagnosis not present

## 2021-08-27 DIAGNOSIS — Z6838 Body mass index (BMI) 38.0-38.9, adult: Secondary | ICD-10-CM | POA: Diagnosis not present

## 2021-08-27 DIAGNOSIS — F17210 Nicotine dependence, cigarettes, uncomplicated: Secondary | ICD-10-CM | POA: Diagnosis present

## 2021-08-27 DIAGNOSIS — I10 Essential (primary) hypertension: Secondary | ICD-10-CM | POA: Diagnosis present

## 2021-08-27 DIAGNOSIS — Z90710 Acquired absence of both cervix and uterus: Secondary | ICD-10-CM | POA: Diagnosis not present

## 2021-08-27 LAB — GLUCOSE, CAPILLARY
Glucose-Capillary: 299 mg/dL — ABNORMAL HIGH (ref 70–99)
Glucose-Capillary: 214 mg/dL — ABNORMAL HIGH (ref 70–99)
Glucose-Capillary: 149 mg/dL — ABNORMAL HIGH (ref 70–99)
Glucose-Capillary: 164 mg/dL — ABNORMAL HIGH (ref 70–99)
Glucose-Capillary: 247 mg/dL — ABNORMAL HIGH (ref 70–99)

## 2021-08-27 LAB — ALPHA-1-ANTITRYPSIN: A-1 Antitrypsin, Ser: 128 mg/dL (ref 101–187)

## 2021-08-27 MED ORDER — HYDROCODONE BIT-HOMATROP MBR 5-1.5 MG/5ML PO SOLN
5.0000 mL | Freq: Four times a day (QID) | ORAL | Status: DC | PRN
  Administered 2021-08-27 – 2021-08-28 (×2): 5 mL via ORAL
  Filled 2021-08-27 (×2): qty 5

## 2021-08-27 MED ORDER — ENSURE MAX PROTEIN PO LIQD
11.0000 [oz_av] | Freq: Every day | ORAL | Status: DC
  Administered 2021-08-27: 11 [oz_av] via ORAL
  Filled 2021-08-27 (×2): qty 330

## 2021-08-27 MED ORDER — ALBUTEROL SULFATE (2.5 MG/3ML) 0.083% IN NEBU
2.5000 mg | INHALATION_SOLUTION | Freq: Three times a day (TID) | RESPIRATORY_TRACT | Status: DC
  Administered 2021-08-28: 2.5 mg via RESPIRATORY_TRACT
  Filled 2021-08-27: qty 3

## 2021-08-27 MED ORDER — INSULIN GLARGINE-YFGN 100 UNIT/ML ~~LOC~~ SOLN
10.0000 [IU] | Freq: Every day | SUBCUTANEOUS | Status: DC
  Administered 2021-08-27: 10 [IU] via SUBCUTANEOUS
  Filled 2021-08-27 (×2): qty 0.1

## 2021-08-27 MED ORDER — ADULT MULTIVITAMIN W/MINERALS CH
1.0000 | ORAL_TABLET | Freq: Every day | ORAL | Status: DC
  Administered 2021-08-27: 1 via ORAL
  Filled 2021-08-27: qty 1

## 2021-08-27 NOTE — Progress Notes (Signed)
?  Progress Note ? ? ?Patient: Barbara Wu:[id number] DOB: 1970-06-01 DOA: 08/25/2021     0 ? ?DOS: the patient was seen and examined on 08/27/2021 ?  ?Brief hospital course: ?This 51 years old female with PMH significant for type 2 diabetes, tobacco use presented in the ED with complaints of cough,  sore throat, wheezing and shortness of breath.  Patient reported all her symptoms started after she went to a baby shower party few days back. 9 peoples came down with a strep throat, she tested positive in PCPs office on 3/29 and was started on amoxicillin.  She has been taking amoxicillin but reports her respiratory symptoms did not get better.  She continues to have cough with shortness of breath.  Patient is admitted for possible bronchitis with acute COPD exacerbation. ? ?Assessment and Plan: ?* COPD with acute exacerbation (HCC) ?Patient presented with cough, shortness of breath, sore throat. ?Influenza, COVID-negative. RVP pending. ?Symptoms consistent with bronchitis with acute COPD exacerbation. ?Continue scheduled and as needed nebulized bronchodilators. ?Continue prednisone. ?Continue ceftriaxone for bronchitis. ?Patient remains on room oxygen, not hypoxic. ?Advised smoking cessation. ?Check alpha-1 antitrypsin. ? ? ?Hyperlipidemia ?Continue pravastatin. ? ?Essential hypertension ?Continue losartan. ? ?Strep throat ?Continue ceftriaxone. ? ?Diabetes mellitus without complication (HCC) ?Continue metformin and glipizide. ?Add mod scale SSI AC in setting of steroids. ?FS are up due to prednisone.  ?Start Lantus 10 units daily. ? ? ? ?Subjective: Patient was seen and examined at bedside.  Overnight events noted. ?Patient reports feeling better but she still has shortness of breath while talking. ? ?Physical Exam: ?Vitals:  ? 08/26/21 2049 08/26/21 2129 08/27/21 0133 08/27/21 0430  ?BP: 133/71   123/74  ?Pulse: 76   68  ?Resp: 20   16  ?Temp: 98.9 ?F (37.2 ?C)   98.4 ?F (36.9 ?C)  ?TempSrc: Oral   Oral  ?SpO2: 96%  100% 98% 99%  ?Weight:      ?Height:      ? ?General exam: Appears comfortable, not in any acute distress.  Deconditioned ?Respiratory system: CTA bilaterally, no wheezing, no crackles, normal respiratory effort. ?Cardiovascular system: S1-S2 heard, regular rate and rhythm, no murmur. ?Gastrointestinal system: Abdomen is soft, non tender, non distended, BS+ ?Central nervous system: Alert, oriented x3, no focal neurological deficits. ?Extremities: No edema, no cyanosis, no clubbing. ?Psychiatry: Mood, insight, judgment normal. ? ? ?Data Reviewed: ?I have Reviewed nursing notes, Vitals, and Lab results since pt's last encounter. Pertinent lab results CBC, BMP ?I have ordered test including CBC, BMP ?I have reviewed the last note from hospitalist,  ?I have discussed pt's care plan and test results with patient.  ? ?Family Communication: No family at bedside ? ?Disposition: ?Status is: Inpatient ?Remains inpatient appropriate because:  ? ?Admitted for acute bronchitis with COPD exacerbation requiring bronchodilators and prednisone. ? ?Anticipated discharge home in 1 to 2 days. ? ? Planned Discharge Destination: Home ? ? ? ? ?Time spent: 35 minutes ? ?Author: ?Cipriano Bunker, MD ?08/27/2021 12:10 PM ? ?For on call review [email or web address].  ?

## 2021-08-27 NOTE — Progress Notes (Signed)
Initial Nutrition Assessment ? ?DOCUMENTATION CODES:  ? ?Obesity unspecified ? ?INTERVENTION:  ? ?-Ensure MAX Protein po daily, each supplement provides 150 kcal and 30 grams of protein  ? ?-Multivitamin with minerals daily ? ?NUTRITION DIAGNOSIS:  ? ?Increased nutrient needs related to chronic illness as evidenced by estimated needs. ? ?GOAL:  ? ?Patient will meet greater than or equal to 90% of their needs ? ?MONITOR:  ? ?PO intake, Supplement acceptance, Weight trends, Labs, I & O's ? ?REASON FOR ASSESSMENT:  ? ?Consult ?Assessment of nutrition requirement/status ? ?ASSESSMENT:  ? ?51 years old female with PMH significant for type 2 diabetes, tobacco use presented in the ED with complaints of cough,  sore throat, wheezing and shortness of breath.  Patient reported all her symptoms started after she went to a baby shower party few days back. 9 peoples came down with a strep throat, she tested positive in PCPs office on 3/29 and was started on amoxicillin.  She has been taking amoxicillin but reports her respiratory symptoms did not get better.  She continues to have cough with shortness of breath.  Patient is admitted for possible bronchitis with acute COPD exacerbation. ? ?Patient in room, staff providing patient care and breathing treatment.  ?Per chart review, pt has been consuming 100% of meals. Pt has not reported any weight loss or poor appetite per MST screen.  ?Pt has been treated for strep throat. But now PO has improved. Will order Ensure Max, given increased needs r/t COPD. ? ?Per weight records, pt with no recent weight loss.  ? ?Medications reviewed. ? ?Labs reviewed: ? CBGs: 149-299 ? ?NUTRITION - FOCUSED PHYSICAL EXAM: ? ?Unable to complete at this time. Receiving patient care. ? ?Diet Order:   ?Diet Order   ? ?       ?  Diet Carb Modified Fluid consistency: Thin; Room service appropriate? Yes  Diet effective now       ?  ? ?  ?  ? ?  ? ? ?EDUCATION NEEDS:  ? ?No education needs have been identified  at this time ? ?Skin:  Skin Assessment: Reviewed RN Assessment ? ?Last BM:  4/1 ? ?Height:  ? ?Ht Readings from Last 1 Encounters:  ?08/26/21 5\' 2"  (1.575 m)  ? ? ?Weight:  ? ?Wt Readings from Last 1 Encounters:  ?08/26/21 96.4 kg  ? ? ?BMI:  Body mass index is 38.87 kg/m?. ? ?Estimated Nutritional Needs:  ? ?Kcal:  1500-1700 ? ?Protein:  70-80g ? ?Fluid:  1.7L/day ? ?10/26/21, MS, RD, LDN ?Inpatient Clinical Dietitian ?Contact information available via [employer] ? ?

## 2021-08-28 DIAGNOSIS — J441 Chronic obstructive pulmonary disease with (acute) exacerbation: Secondary | ICD-10-CM | POA: Diagnosis not present

## 2021-08-28 LAB — GLUCOSE, CAPILLARY: Glucose-Capillary: 102 mg/dL — ABNORMAL HIGH (ref 70–99)

## 2021-08-28 MED ORDER — MOMETASONE FURO-FORMOTEROL FUM 200-5 MCG/ACT IN AERO
2.0000 | INHALATION_SPRAY | Freq: Two times a day (BID) | RESPIRATORY_TRACT | 1 refills | Status: AC
Start: 2021-08-28 — End: ?

## 2021-08-28 MED ORDER — BENZONATATE 100 MG PO CAPS: 100.0000 mg | ORAL_CAPSULE | Freq: Three times a day (TID) | ORAL | 0 refills | Status: AC

## 2021-08-28 MED ORDER — HYDROCODONE BIT-HOMATROP MBR 5-1.5 MG/5ML PO SOLN
5.0000 mL | Freq: Four times a day (QID) | ORAL | 0 refills | Status: AC | PRN
Start: 2021-08-28 — End: 2021-08-31

## 2021-08-28 NOTE — Plan of Care (Signed)

## 2021-08-28 NOTE — Discharge Summary (Signed)
?Physician Discharge Summary ?  ?Patient: Barbara Wu MRN: [id number] DOB: February 23, 1971  ?Admit date:     08/25/2021  ?Discharge date: 08/28/21  ?Discharge Physician: Cipriano Bunker  ? ?PCP: Roe Rutherford, NP  ? ?Recommendations at discharge:  ?Advised to follow-up with primary care physician in 1 week. ?Advised to take Hycodan for cough as needed. ?Patient has completed antibiotics treatment for bronchitis/pharyngitis. ?Continue bronchodilators as needed. ? ? ?Discharge Diagnoses: ?Principal Problem: ?  COPD with acute exacerbation (HCC) ?Active Problems: ?  Diabetes mellitus without complication (HCC) ?  Strep throat ?  Essential hypertension ?  Hyperlipidemia ? ?Resolved Problems: ?  * No resolved hospital problems. * ? ?Hospital Course: ?This 51 years old female with PMH significant for type 2 diabetes, tobacco use presented in the ED with complaints of cough, sore throat, wheezing and shortness of breath.  Patient reported all her symptoms started after she went to a baby shower party few days back. 9 peoples came down with a strep throat, she herself tested positive in PCPs office on 3/29 and was started on amoxicillin. She has been taking amoxicillin but reports her respiratory symptoms did not get better.  She continues to have cough with shortness of breath.   ?Patient was admitted for possible bronchitis with acute COPD exacerbation.  Patient was started on a scheduled and as needed bronchodilators, she is also started on prednisone and continued on ceftriaxone.  She continues to get better, she was started on Hycodan for severe cough, she was able to ambulate without any difficulty breathing.  Patient felt much better, improved, completed antibiotics for 5 days.  Patient is being discharged home. ? ?Assessment and Plan: ?* COPD with acute exacerbation (HCC) ?Patient presented with cough, shortness of breath, sore throat. ?Influenza, COVID-negative. RVP : rhinovirus ?Symptoms consistent with bronchitis  with acute COPD exacerbation. ?Continue scheduled and as needed nebulized bronchodilators. ?Completed prednisone for 4 days. ?Completed ceftriaxone for bronchitis. ?Patient remains on room oxygen, not hypoxic. ?Advised smoking cessation. ?Alpha 1 antitrypsin 128 which is normal ? ? ?Hyperlipidemia ?Continue pravastatin. ? ?Essential hypertension ?Continue losartan. ? ?Strep throat ?Completed course of antibiotics. ? ?Diabetes mellitus without complication (HCC) ?Continue metformin and glipizide. ? ? ? ? ?Obesity: ?Diet and exercise discussed in detail. ? ?Estimated body mass index is 38.87 kg/m? as calculated from the following: ?  Height as of this encounter: 5\' 2"  (1.575 m). ?  Weight as of this encounter: 96.4 kg.  ? ?  ?Pain control - Controlled Substance Reporting System database was reviewed. and patient was instructed, not to drive, operate heavy machinery, perform activities at heights, swimming or participation in water activities or provide baby-sitting services while on Pain, Sleep and Anxiety Medications; until their outpatient Physician has advised to do so again. Also recommended to not to take more than prescribed Pain, Sleep and Anxiety Medications.  ? ?Consultants: None. ? ?Procedures performed: None ? ?Disposition: Home ? ?Diet recommendation:  ?Discharge Diet Orders (From admission, onward)  ? ?  Start     Ordered  ? 08/28/21 0000  Diet - low sodium heart healthy       ? 08/28/21 0951  ? 08/28/21 0000  Diet Carb Modified       ? 08/28/21 0951  ? ?  ?  ? ?  ? ?Carb modified diet ?DISCHARGE MEDICATION: ?Allergies as of 08/28/2021   ? ?   Reactions  ? Demerol Hives  ? Morphine And Related Hives  ? Other Hives  ?  Eggplant allergy  ? Tape Other (See Comments)  ? Band-aid (leaves a burn spot on skin)  ? Meperidine Other (See Comments)  ? HIVES  ? ?  ? ?  ?Medication List  ?  ? ?STOP taking these medications   ? ?amoxicillin 875 MG tablet ?Commonly known as: AMOXIL ?  ? ?  ? ?TAKE these  medications   ? ?albuterol (2.5 MG/3ML) 0.083% nebulizer solution ?Commonly known as: PROVENTIL ?Take 2.5 mg by nebulization every 4 (four) hours as needed for wheezing or shortness of breath. ?  ?albuterol 108 (90 Base) MCG/ACT inhaler ?Commonly known as: VENTOLIN HFA ?Inhale 2 puffs into the lungs every 6 (six) hours as needed for wheezing or shortness of breath. ?  ?amitriptyline 25 MG tablet ?Commonly known as: ELAVIL ?Take 25 mg by mouth at bedtime. ?  ?benzonatate 100 MG capsule ?Commonly known as: TESSALON ?Take 1 capsule (100 mg total) by mouth 3 (three) times daily. ?  ?clonazePAM 0.5 MG tablet ?Commonly known as: KLONOPIN ?Take 0.5 mg by mouth daily as needed for anxiety. ?  ?glipiZIDE 10 MG 24 hr tablet ?Commonly known as: GLUCOTROL XL ?Take 10 mg by mouth 2 (two) times daily. ?  ?HYDROcodone bit-homatropine 5-1.5 MG/5ML syrup ?Commonly known as: HYCODAN ?Take 5 mLs by mouth every 6 (six) hours as needed for up to 3 days for cough. ?  ?hydrOXYzine 25 MG tablet ?Commonly known as: ATARAX ?Take 25 mg by mouth at bedtime. ?  ?losartan 25 MG tablet ?Commonly known as: COZAAR ?Take 25 mg by mouth daily. ?  ?metFORMIN 1000 MG tablet ?Commonly known as: GLUCOPHAGE ?Take 1,000 mg by mouth 2 (two) times daily. ?  ?mometasone-formoterol 200-5 MCG/ACT Aero ?Commonly known as: DULERA ?Inhale 2 puffs into the lungs 2 (two) times daily. ?  ?pravastatin 10 MG tablet ?Commonly known as: PRAVACHOL ?Take 10 mg by mouth daily. ?  ?promethazine-dextromethorphan 6.25-15 MG/5ML syrup ?Commonly known as: PROMETHAZINE-DM ?Take 5 mLs by mouth every 4 (four) hours as needed for cough. ?  ?venlafaxine XR 150 MG 24 hr capsule ?Commonly known as: EFFEXOR-XR ?Take 150 mg by mouth daily. ?  ? ?  ? ? Follow-up Information   ? ? Roe RutherfordKeatts, Courtney, NP Follow up in 1 week(s).   ?Specialty: Adult Health Nurse Practitioner ?Contact information: ?[address] ?[address] ?[place] [id number] ?[phone] ? ? ?  ?  ? ?  ?  ? ?  ? ?Discharge  Exam: ?Filed Weights  ? 08/26/21 0042  ?Weight: 96.4 kg  ? ?General exam: Appears comfortable, not in any acute distress. ?Respiratory system: CTA bilaterally, no wheezing, no crackles, normal respiratory effort ?Cardiovascular system: S1-S2 heard, regular rate and rhythm, no murmur. ?Gastrointestinal system: Abdomen is soft, nontender, nondistended, BS+ ?Central nervous system: Alert, oriented x3, no focal neurological deficits. ?Extremities: No edema, no cyanosis, no clubbing. ?Psychiatry: Mood, insight, judgment normal. ? ? ?Condition at discharge: good ? ?The results of significant diagnostics from this hospitalization (including imaging, microbiology, ancillary and laboratory) are listed below for reference.  ? ?Imaging Studies: ?DG Chest 2 View ? ?Result Date: 08/25/2021 ?CLINICAL DATA:  Shortness of breath EXAM: CHEST - 2 VIEW COMPARISON:  July 31, 2014 FINDINGS: The heart, hila, and mediastinum are normal. No pneumothorax. Mild bibasilar opacities, right greater than left. No overt edema. No other acute abnormalities. IMPRESSION: Subtle hazy opacities in the bases, right greater than left, could represent developing infiltrate. Recommend short-term follow-up imaging to ensure resolution. Electronically Signed  By: Gerome Sam III M.D.   On: 08/25/2021 18:06   ? ?Microbiology: ?Results for orders placed or performed during the hospital encounter of 08/25/21  ?Respiratory (~20 pathogens) panel by PCR     Status: Abnormal  ? Collection Time: 08/25/21 12:17 AM  ? Specimen: Nasopharyngeal Swab; Respiratory  ?Result Value Ref Range Status  ? Adenovirus NOT DETECTED NOT DETECTED Final  ? Coronavirus 229E NOT DETECTED NOT DETECTED Final  ?  Comment: (NOTE) ?The Coronavirus on the Respiratory Panel, DOES NOT test for the novel  ?Coronavirus (2019 nCoV) ?  ? Coronavirus HKU1 NOT DETECTED NOT DETECTED Final  ? Coronavirus NL63 NOT DETECTED NOT DETECTED Final  ? Coronavirus OC43 NOT DETECTED NOT DETECTED Final  ?  Metapneumovirus NOT DETECTED NOT DETECTED Final  ? Rhinovirus / Enterovirus DETECTED (A) NOT DETECTED Final  ? Influenza A NOT DETECTED NOT DETECTED Final  ? Influenza B NOT DETECTED NOT DETECTED Final  ? [place]

## 2021-08-28 NOTE — Discharge Instructions (Signed)
Advised to follow-up with primary care physician in 1 week. ?Advised to take Hycodan for cough as needed. ?Patient has completed antibiotic treatment for bronchitis/pharyngitis. ?Continue bronchodilators as needed. ?

## 2021-08-30 LAB — GLUCOSE, CAPILLARY: Glucose-Capillary: 281 mg/dL — ABNORMAL HIGH (ref 70–99)

## 2022-02-14 ENCOUNTER — Encounter (HOSPITAL_COMMUNITY): Payer: Self-pay

## 2022-02-14 ENCOUNTER — Other Ambulatory Visit: Payer: Self-pay

## 2022-02-14 ENCOUNTER — Emergency Department (HOSPITAL_COMMUNITY)
Admission: EM | Admit: 2022-02-14 | Discharge: 2022-02-14 | Disposition: A | Discharge status: Home / Self Care | Payer: Medicare Other | Attending: Emergency Medicine | Admitting: Emergency Medicine

## 2022-02-14 ENCOUNTER — Emergency Department (HOSPITAL_COMMUNITY): Payer: Medicare Other

## 2022-02-14 DIAGNOSIS — Z7952 Long term (current) use of systemic steroids: Secondary | ICD-10-CM | POA: Diagnosis not present

## 2022-02-14 DIAGNOSIS — R0602 Shortness of breath: Secondary | ICD-10-CM | POA: Diagnosis present

## 2022-02-14 DIAGNOSIS — Z20822 Contact with and (suspected) exposure to covid-19: Secondary | ICD-10-CM | POA: Diagnosis not present

## 2022-02-14 DIAGNOSIS — Z7951 Long term (current) use of inhaled steroids: Secondary | ICD-10-CM | POA: Insufficient documentation

## 2022-02-14 DIAGNOSIS — J441 Chronic obstructive pulmonary disease with (acute) exacerbation: Secondary | ICD-10-CM | POA: Diagnosis not present

## 2022-02-14 LAB — COMPREHENSIVE METABOLIC PANEL
ALT: 39 U/L (ref 0–44)
AST: 30 U/L (ref 15–41)
Albumin: 4.2 g/dL (ref 3.5–5.0)
Alkaline Phosphatase: 49 U/L (ref 38–126)
Anion gap: 11 (ref 5–15)
BUN: 19 mg/dL (ref 6–20)
CO2: 23 mmol/L (ref 22–32)
Calcium: 10 mg/dL (ref 8.9–10.3)
Chloride: 105 mmol/L (ref 98–111)
Creatinine, Ser: 0.97 mg/dL (ref 0.44–1.00)
GFR, Estimated: >60 mL/min (ref >60)
Glucose, Bld: 400 mg/dL — ABNORMAL HIGH (ref 70–99)
Potassium: 4.1 mmol/L (ref 3.5–5.1)
Sodium: 139 mmol/L (ref 135–145)
Total Bilirubin: 0.7 mg/dL (ref 0.3–1.2)
Total Protein: 7.3 g/dL (ref 6.5–8.1)

## 2022-02-14 LAB — CBC WITH DIFFERENTIAL/PLATELET
Abs Immature Granulocytes: 0.02 10*3/uL (ref 0.00–0.07)
Basophils Absolute: 0.1 10*3/uL (ref 0.0–0.1)
Basophils Relative: 1 %
Eosinophils Absolute: 0.1 10*3/uL (ref 0.0–0.5)
Eosinophils Relative: 2 %
HCT: 41.7 % (ref 36.0–46.0)
Hemoglobin: 14.2 g/dL (ref 12.0–15.0)
Immature Granulocytes: 0 %
Lymphocytes Relative: 45 %
Lymphs Abs: 2.6 10*3/uL (ref 0.7–4.0)
MCH: 30.7 pg (ref 26.0–34.0)
MCHC: 34.1 g/dL (ref 30.0–36.0)
MCV: 90.3 fL (ref 80.0–100.0)
Monocytes Absolute: 0.4 10*3/uL (ref 0.1–1.0)
Monocytes Relative: 6 %
Neutro Abs: 2.6 10*3/uL (ref 1.7–7.7)
Neutrophils Relative %: 46 %
Platelets: 246 10*3/uL (ref 150–400)
RBC: 4.62 MIL/uL (ref 3.87–5.11)
RDW: 12.1 % (ref 11.5–15.5)
WBC: 5.8 10*3/uL (ref 4.0–10.5)
nRBC: 0 % (ref 0.0–0.2)

## 2022-02-14 LAB — URINALYSIS, ROUTINE W REFLEX MICROSCOPIC
Bilirubin Urine: NEGATIVE
Glucose, UA: >=500 mg/dL — AB
Ketones, ur: 20 mg/dL — AB
Leukocytes,Ua: NEGATIVE
Nitrite: NEGATIVE
Protein, ur: NEGATIVE mg/dL
Specific Gravity, Urine: 1.035 — ABNORMAL HIGH (ref 1.005–1.030)
pH: 5 (ref 5.0–8.0)

## 2022-02-14 LAB — RESP PANEL BY RT-PCR (FLU A&B, COVID) ARPGX2
Influenza A by PCR: NEGATIVE
Influenza B by PCR: NEGATIVE
SARS Coronavirus 2 by RT PCR: NEGATIVE

## 2022-02-14 MED ORDER — DOXYCYCLINE HYCLATE 100 MG PO CAPS: 100.0000 mg | ORAL_CAPSULE | Freq: Two times a day (BID) | ORAL | 0 refills | Status: AC

## 2022-02-14 MED ORDER — IPRATROPIUM-ALBUTEROL 0.5-2.5 (3) MG/3ML IN SOLN
3.0000 mL | Freq: Once | RESPIRATORY_TRACT | Status: AC
Start: 2022-02-14 — End: 2022-02-14
  Administered 2022-02-14: 3 mL via RESPIRATORY_TRACT
  Filled 2022-02-14: qty 3

## 2022-02-14 MED ORDER — SODIUM CHLORIDE 0.9 % IV BOLUS
500.0000 mL | Freq: Once | INTRAVENOUS | Status: AC
  Administered 2022-02-14: 500 mL via INTRAVENOUS

## 2022-02-14 MED ORDER — ALBUTEROL SULFATE (2.5 MG/3ML) 0.083% IN NEBU
2.5000 mg | INHALATION_SOLUTION | Freq: Once | RESPIRATORY_TRACT | Status: AC
Start: 2022-02-14 — End: 2022-02-14
  Administered 2022-02-14: 2.5 mg via RESPIRATORY_TRACT
  Filled 2022-02-14: qty 3

## 2022-02-14 MED ORDER — PREDNISONE 20 MG PO TABS: ORAL_TABLET | ORAL | 0 refills | Status: AC

## 2022-02-14 MED ORDER — MAGNESIUM SULFATE 2 GM/50ML IV SOLN
2.0000 g | Freq: Once | INTRAVENOUS | Status: AC
  Administered 2022-02-14: 2 g via INTRAVENOUS
  Filled 2022-02-14: qty 50

## 2022-02-14 MED ORDER — ALBUTEROL SULFATE (2.5 MG/3ML) 0.083% IN NEBU
2.5000 mg | INHALATION_SOLUTION | Freq: Once | RESPIRATORY_TRACT | Status: AC
  Administered 2022-02-14: 2.5 mg via RESPIRATORY_TRACT
  Filled 2022-02-14: qty 3

## 2022-02-14 MED ORDER — METHYLPREDNISOLONE SODIUM SUCC 125 MG IJ SOLR
125.0000 mg | Freq: Once | INTRAMUSCULAR | Status: AC
  Administered 2022-02-14: 125 mg via INTRAVENOUS
  Filled 2022-02-14: qty 2

## 2022-02-14 MED ORDER — GUAIFENESIN ER 600 MG PO TB12
600.0000 mg | ORAL_TABLET | Freq: Two times a day (BID) | ORAL | 0 refills | Status: AC | PRN
Start: 2022-02-14 — End: ?

## 2022-02-14 MED ORDER — ALBUTEROL SULFATE HFA 108 (90 BASE) MCG/ACT IN AERS
1.0000 | INHALATION_SPRAY | Freq: Once | RESPIRATORY_TRACT | Status: AC
  Administered 2022-02-14: 2 via RESPIRATORY_TRACT
  Filled 2022-02-14: qty 6.7

## 2022-02-14 NOTE — Discharge Instructions (Signed)
Use your nebulized treatments as prescribed.  Follow-up with your doctor next week for recheck

## 2022-02-14 NOTE — ED Provider Notes (Signed)
[facility]-EMERGENCY DEPT Provider Note   CSN: [id number] Arrival date & time: 02/14/22  1908     History  Chief Complaint  Patient presents with   Shortness of Breath    Barbara Wu is a 51 y.o. female.  Patient with a history of COPD.  Patient complains of shortness of breath and cough  The history is provided by the patient and medical records. No language interpreter was used.  Shortness of Breath Severity:  Moderate Onset quality:  Sudden Timing:  Constant Progression:  Worsening Chronicity:  Recurrent Context: activity   Relieved by:  Nothing Worsened by:  Nothing Ineffective treatments:  None tried Associated symptoms: no abdominal pain, no chest pain, no cough, no headaches and no rash        Home Medications Prior to Admission medications   Medication Sig Start Date End Date Taking? Authorizing Provider  doxycycline (VIBRAMYCIN) 100 MG capsule Take 1 capsule (100 mg total) by mouth 2 (two) times daily. One po bid x 7 days 02/14/22  Yes Bethann Berkshire, MD  guaiFENesin (MUCINEX) 600 MG 12 hr tablet Take 1 tablet (600 mg total) by mouth 2 (two) times daily as needed for to loosen phlegm. 02/14/22  Yes Bethann Berkshire, MD  predniSONE (DELTASONE) 20 MG tablet 2 tabs po daily x 3 days 02/14/22  Yes Bethann Berkshire, MD  albuterol (PROVENTIL) (2.5 MG/3ML) 0.083% nebulizer solution Take 2.5 mg by nebulization every 4 (four) hours as needed for wheezing or shortness of breath. 08/22/21   [provider]  albuterol (VENTOLIN HFA) 108 (90 Base) MCG/ACT inhaler Inhale 2 puffs into the lungs every 6 (six) hours as needed for wheezing or shortness of breath. 08/22/21   [provider]  amitriptyline (ELAVIL) 25 MG tablet Take 25 mg by mouth at bedtime.    [provider]  benzonatate (TESSALON) 100 MG capsule Take 1 capsule (100 mg total) by mouth 3 (three) times daily. 08/28/21   Cipriano Bunker, MD  clonazePAM (KLONOPIN) 0.5 MG tablet  Take 0.5 mg by mouth daily as needed for anxiety. 07/25/21   [provider]  glipiZIDE (GLUCOTROL XL) 10 MG 24 hr tablet Take 10 mg by mouth 2 (two) times daily. 07/25/21   [provider]  hydrOXYzine (ATARAX/VISTARIL) 25 MG tablet Take 25 mg by mouth at bedtime.    [provider]  hydrOXYzine (VISTARIL) 25 MG capsule Take 25 mg by mouth at bedtime. 02/04/22   [provider]  losartan (COZAAR) 25 MG tablet Take 25 mg by mouth daily. 07/25/21   [provider]  metFORMIN (GLUCOPHAGE) 1000 MG tablet Take 1,000 mg by mouth 2 (two) times daily. 07/25/21   [provider]  mometasone-formoterol (DULERA) 200-5 MCG/ACT AERO Inhale 2 puffs into the lungs 2 (two) times daily. 08/28/21   Cipriano Bunker, MD  pravastatin (PRAVACHOL) 10 MG tablet Take 10 mg by mouth daily. 07/25/21   [provider]  promethazine-dextromethorphan (PROMETHAZINE-DM) 6.25-15 MG/5ML syrup Take 5 mLs by mouth every 4 (four) hours as needed for cough. 08/22/21   [provider]  venlafaxine XR (EFFEXOR-XR) 150 MG 24 hr capsule Take 150 mg by mouth daily. 07/25/21   [provider]      Allergies    Demerol, Morphine and related, Other, Tape, and Meperidine    Review of Systems   Review of Systems  Constitutional:  Negative for appetite change and fatigue.  HENT:  Negative for congestion, ear discharge and sinus pressure.  Eyes:  Negative for discharge.  Respiratory:  Positive for shortness of breath. Negative for cough.   Cardiovascular:  Negative for chest pain.  Gastrointestinal:  Negative for abdominal pain and diarrhea.  Genitourinary:  Negative for frequency and hematuria.  Musculoskeletal:  Negative for back pain.  Skin:  Negative for rash.  Neurological:  Negative for seizures and headaches.  Psychiatric/Behavioral:  Negative for hallucinations.     Physical Exam Updated Vital Signs BP 138/66   Pulse 94   Temp 98.3 F (36.8 C)   Resp 17    Ht 5\' 2"  (1.575 m)   Wt 98.9 kg   SpO2 94%   BMI 39.87 kg/m  Physical Exam Vitals and nursing note reviewed.  Constitutional:      Appearance: She is well-developed.  HENT:     Head: Normocephalic.     Nose: Nose normal.  Eyes:     General: No scleral icterus.    Conjunctiva/sclera: Conjunctivae normal.  Neck:     Thyroid: No thyromegaly.  Cardiovascular:     Rate and Rhythm: Normal rate and regular rhythm.     Heart sounds: No murmur heard.    No friction rub. No gallop.  Pulmonary:     Breath sounds: No stridor. Wheezing present. No rales.  Chest:     Chest wall: No tenderness.  Abdominal:     General: There is no distension.     Tenderness: There is no abdominal tenderness. There is no rebound.  Musculoskeletal:        General: Normal range of motion.     Cervical back: Neck supple.  Lymphadenopathy:     Cervical: No cervical adenopathy.  Skin:    Findings: No erythema or rash.  Neurological:     Mental Status: She is alert and oriented to person, place, and time.     Motor: No abnormal muscle tone.     Coordination: Coordination normal.  Psychiatric:        Behavior: Behavior normal.     ED Results / Procedures / Treatments   Labs (all labs ordered are listed, but only abnormal results are displayed) Labs Reviewed  COMPREHENSIVE METABOLIC PANEL - Abnormal; Notable for the following components:      Result Value   Glucose, Bld 400 (*)    All other components within normal limits  URINALYSIS, ROUTINE W REFLEX MICROSCOPIC - Abnormal; Notable for the following components:   Color, Urine STRAW (*)    Specific Gravity, Urine 1.035 (*)    Glucose, UA >=500 (*)    Hgb urine dipstick SMALL (*)    Ketones, ur 20 (*)    Bacteria, UA RARE (*)    All other components within normal limits  RESP PANEL BY RT-PCR (FLU A&B, COVID) ARPGX2  CBC WITH DIFFERENTIAL/PLATELET    EKG None  Radiology DG Chest 2 View  Result Date: 02/14/2022 CLINICAL DATA:  Cough and  shortness of breath for 2 weeks EXAM: CHEST - 2 VIEW COMPARISON:  08/25/2021 FINDINGS: Previously seen opacities in the bases have resolved in the interval. No sizable effusion is seen. Cardiac shadow is within normal limits. No bony abnormality is noted. IMPRESSION: Resolution of previously seen airspace opacities. Electronically Signed   By: 10/25/2021 M.D.   On: 02/14/2022 20:11    Procedures Procedures    Medications Ordered in ED Medications  ipratropium-albuterol (DUONEB) 0.5-2.5 (3) MG/3ML nebulizer solution 3 mL (has no administration in time range)  albuterol (PROVENTIL) (2.5 MG/3ML) 0.083% nebulizer  solution 2.5 mg (has no administration in time range)  albuterol (VENTOLIN HFA) 108 (90 Base) MCG/ACT inhaler 1-2 puff (2 puffs Inhalation Given 02/14/22 1955)  ipratropium-albuterol (DUONEB) 0.5-2.5 (3) MG/3ML nebulizer solution 3 mL (3 mLs Nebulization Given 02/14/22 2117)  albuterol (PROVENTIL) (2.5 MG/3ML) 0.083% nebulizer solution 2.5 mg (2.5 mg Nebulization Given 02/14/22 2116)  methylPREDNISolone sodium succinate (SOLU-MEDROL) 125 mg/2 mL injection 125 mg (125 mg Intravenous Given 02/14/22 2117)  magnesium sulfate IVPB 2 g 50 mL (2 g Intravenous New Bag/Given 02/14/22 2128)  sodium chloride 0.9 % bolus 500 mL (500 mLs Intravenous Bolus 02/14/22 2128)    ED Course/ Medical Decision Making/ A&P                           Medical Decision Making Risk OTC drugs. Prescription drug management.  This patient presents to the ED for concern of shortness of breath, this involves an extensive number of treatment options, and is a complaint that carries with it a high risk of complications and morbidity.  The differential diagnosis includes pneumonia, PE, COPD   Co morbidities that complicate the patient evaluation  COPD   Additional history obtained:  Additional history obtained from the patient External records from outside source obtained and reviewed including hospital  record   Lab Tests:  I Ordered, and personally interpreted labs.  The pertinent results include: Glucose 400   Imaging Studies ordered:  I ordered imaging studies including chest x-ray I independently visualized and interpreted imaging which showed no acute disease I agree with the radiologist interpretation   Cardiac Monitoring: / EKG:  The patient was maintained on a cardiac monitor.  I personally viewed and interpreted the cardiac monitored which showed an underlying rhythm of: Normal sinus rhythm   Consultations Obtained:  No consultant Problem List / ED Course / Critical interventions / Medication management  COPD exacerbation I ordered medication including steroids and an albuterol and Atrovent Reevaluation of the patient after these medicines showed that the patient improved I have reviewed the patients home medicines and have made adjustments as needed   Social Determinants of Health:  None   Test / Admission - Considered:  Additional test needed  Patient improved with neb treatments in the emergency department she will be sent home with prednisone and doxycycline and Mucinex and will use her albuterol at home.  Patient will follow-up with PCP if not improving        Final Clinical Impression(s) / ED Diagnoses Final diagnoses:  COPD exacerbation ([place])    Rx / DC Orders ED Discharge Orders          Ordered    predniSONE (DELTASONE) 20 MG tablet        02/14/22 2315    doxycycline (VIBRAMYCIN) 100 MG capsule  2 times daily        02/14/22 2315    guaiFENesin (MUCINEX) 600 MG 12 hr tablet  2 times daily PRN        02/14/22 2315              Milton Ferguson, MD 02/16/22 1257

## 2022-02-14 NOTE — ED Provider Triage Note (Signed)
Emergency Medicine Provider Triage Evaluation Note  Barbara Wu , a 51 y.o. female  was evaluated in triage.  Pt complains of non productive cough for the past two weeks. She reports she has been having some chest pain with the cough for the past weeks. Denies any fever, nasal congestion, or rhinorrhea.  Review of Systems  Positive:  Negative:   Physical Exam  BP (!) 185/88   Pulse (!) 112   Temp 98.3 F (36.8 C)   Resp (!) 26   Ht 5\' 2"  (1.575 m)   Wt 98.9 kg   SpO2 98%   BMI 39.87 kg/m  Gen:   Awake, no distress   Resp:  Normal effort  MSK:   Moves extremities without difficulty  Other:  Some mild inspiratory and moderate expiratory wheezing.   Medical Decision Making  Medically screening exam initiated at 7:51 PM.  Appropriate orders placed.  Barbara Wu was informed that the remainder of the evaluation will be completed by another provider, this initial triage assessment does not replace that evaluation, and the importance of remaining in the ED until their evaluation is complete.  Albuterol inhaler and labs ordered.    Barbara Wu, [place] 02/14/22 [phone]

## 2022-02-14 NOTE — ED Triage Notes (Addendum)
Ambulatory to ED with c/o shortness of breath x 2 weeks. States she thought it was just a URI and would get better. Today felt like she couldn't catch her breath. Used home inhaler & nebulizer without relief. Denies sick exposures. Hx of PNA.
